# Patient Record
Sex: Female | Born: 1965 | Race: White | Hispanic: No | Marital: Married | Smoking: Current every day smoker
Health system: Southern US, Community
[De-identification: ages and names within clinical notes are randomized; demographics above are authoritative.]

## PROBLEM LIST (undated history)

## (undated) DIAGNOSIS — G610 Guillain-Barre syndrome: Secondary | ICD-10-CM

## (undated) DIAGNOSIS — K219 Gastro-esophageal reflux disease without esophagitis: Secondary | ICD-10-CM

## (undated) DIAGNOSIS — T8859XA Other complications of anesthesia, initial encounter: Secondary | ICD-10-CM

## (undated) DIAGNOSIS — R112 Nausea with vomiting, unspecified: Secondary | ICD-10-CM

## (undated) DIAGNOSIS — T4145XA Adverse effect of unspecified anesthetic, initial encounter: Secondary | ICD-10-CM

## (undated) DIAGNOSIS — Z9889 Other specified postprocedural states: Secondary | ICD-10-CM

## (undated) HISTORY — PX: CHOLECYSTECTOMY: SHX55

## (undated) HISTORY — PX: OOPHORECTOMY: SHX86

## (undated) HISTORY — PX: MENISCUS REPAIR: SHX5179

## (undated) HISTORY — PX: ABDOMINAL HYSTERECTOMY: SHX81

## (undated) HISTORY — PX: APPENDECTOMY: SHX54

---

## 1991-03-03 DIAGNOSIS — G610 Guillain-Barre syndrome: Secondary | ICD-10-CM

## 1991-03-03 HISTORY — DX: Guillain-Barre syndrome: G61.0

## 2004-09-30 ENCOUNTER — Emergency Department: Payer: Self-pay | Admitting: Emergency Medicine

## 2006-02-04 ENCOUNTER — Emergency Department: Payer: Self-pay | Admitting: Emergency Medicine

## 2007-02-15 ENCOUNTER — Ambulatory Visit: Payer: Self-pay | Admitting: Internal Medicine

## 2009-07-15 ENCOUNTER — Ambulatory Visit: Payer: Self-pay | Admitting: Internal Medicine

## 2010-11-04 ENCOUNTER — Ambulatory Visit: Payer: Self-pay | Admitting: Physician Assistant

## 2010-12-03 ENCOUNTER — Ambulatory Visit: Payer: Self-pay | Admitting: General Practice

## 2011-04-16 ENCOUNTER — Ambulatory Visit: Payer: Self-pay | Admitting: Family Medicine

## 2011-10-29 ENCOUNTER — Ambulatory Visit: Payer: Self-pay

## 2011-10-29 ENCOUNTER — Ambulatory Visit: Payer: Self-pay | Admitting: Medical

## 2011-10-29 LAB — COMPREHENSIVE METABOLIC PANEL
Albumin: 4.1 g/dL (ref 3.4–5.0)
Alkaline Phosphatase: 146 U/L — ABNORMAL HIGH (ref 50–136)
Bilirubin,Total: 0.9 mg/dL (ref 0.2–1.0)
Chloride: 104 mmol/L (ref 98–107)
Co2: 29 mmol/L (ref 21–32)
Creatinine: 1.1 mg/dL (ref 0.60–1.30)
EGFR (Non-African Amer.): >60
Glucose: 107 mg/dL — ABNORMAL HIGH (ref 65–99)
Osmolality: 278 (ref 275–301)
Sodium: 139 mmol/L (ref 136–145)
Total Protein: 7.5 g/dL (ref 6.4–8.2)

## 2011-10-29 LAB — CBC WITH DIFFERENTIAL/PLATELET
Basophil #: 0 10*3/uL (ref 0.0–0.1)
MCH: 31.8 pg (ref 26.0–34.0)
MCHC: 33.8 g/dL (ref 32.0–36.0)
Monocyte #: 0.8 x10 3/mm (ref 0.2–0.9)
Neutrophil %: 71.3 %
Platelet: 173 10*3/uL (ref 150–440)
WBC: 6.9 10*3/uL (ref 3.6–11.0)

## 2011-10-29 LAB — URINALYSIS, COMPLETE
Blood: NEGATIVE
Glucose,UR: NEGATIVE mg/dL (ref 0–75)
Ketone: NEGATIVE
Leukocyte Esterase: NEGATIVE
Ph: 6 (ref 4.5–8.0)
Protein: 30
Specific Gravity: 1.025 (ref 1.003–1.030)

## 2011-10-30 ENCOUNTER — Ambulatory Visit: Payer: Self-pay | Admitting: Family Medicine

## 2011-11-10 ENCOUNTER — Ambulatory Visit: Payer: Self-pay | Admitting: Family Medicine

## 2011-11-23 ENCOUNTER — Ambulatory Visit: Payer: Self-pay | Admitting: Surgery

## 2011-11-23 LAB — POTASSIUM: Potassium: 3.8 mmol/L (ref 3.5–5.1)

## 2013-01-18 ENCOUNTER — Ambulatory Visit: Payer: Self-pay | Admitting: Family Medicine

## 2013-05-17 ENCOUNTER — Ambulatory Visit: Payer: Self-pay | Admitting: Physician Assistant

## 2014-06-19 NOTE — Op Note (Signed)
PATIENT NAME:  Alyssa Johnson MR#:  [id number] DATE OF BIRTH:  24-Mar-1965  DATE OF PROCEDURE:  11/23/2011  PREOPERATIVE DIAGNOSIS: Symptomatic cholelithiasis.  POSTOPERATIVE DIAGNOSIS: Gallbladder hydrops.   PROCEDURE PERFORMED: Laparoscopic cholecystectomy with cholangiogram.   SURGEON: Marlyce Huge, MD  ANESTHESIA: General.   ESTIMATED BLOOD LOSS: 20 mL.   COMPLICATIONS: None.   SPECIMENS: Gallbladder.   INTRAOPERATIVE FINDINGS: Normal cholangiogram and gallbladder hydrops.   INDICATION FOR SURGERY: [name] is a pleasant 49 year old female with history of right upper quadrant pain and an ultrasound which showed stones that had been going on for the past year, on and off, related to food. I thought that she would benefit from laparoscopic cholecystectomy.   DETAILS OF PROCEDURE: Informed consent was obtained. The patient was brought to the operating room suite. She was laid supine on the operating room table. She was induced, endotracheal tube was placed, and general anesthesia was administered. Her abdomen was then prepped and draped in standard surgical fashion. A time out was then performed correctly identifying the patient name, operative site, and procedure to be performed. A supraumbilical incision was made. This was deepened down to the fascia, the fascia was grasped, the fascia was incised, and a sterile finger was put through the fascia. There were no adhesions to the underlying fascia. Two 0 Vicryl stay sutures were placed through the fascia. A Hassan trocar was placed into the abdomen and the abdomen was insufflated. A 10 mm 30 degree scope was placed through the trocar. The gallbladder was then visualized. It appeared to be stuck to the surrounding tissues. We then placed an 11 mm epigastric port and two 5 mm subcostal ports, one the midclavicular line and one at the anterior axillary line. The gallbladder was grasped by the fundus. It was retracted over the dome of the  liver. Adhesions were taken off. The cystic duct and cystic artery were dissected out. The critical view was obtained. I then placed a clip across the proximal cystic duct and ductotomy was made. A cholangiogram catheter was then placed through the duct. Cholangiogram was performed which showed good proximal and distal filling and free flow of contrast into the duodenum. The catheter was then withdrawn. The duct was then clipped twice down and ligated. The cystic artery was clipped three times and ligated. The gallbladder was then taken off the gallbladder fossa using electrocautery. There was leakage of gallbladder contents which was clear consistent with hydrops. The gallbladder was then taken out with an endocatch bag. The abdomen was then irrigated with a liter of sterile normal saline. All hemostasis was achieved. The trocars were then taken out under direct visualization. The supraumbilical incision was closed with the previously placed stay sutures as well as a single interrupted 0 Vicryl transfascial suture. The skin was then closed using interrupted 4-0 Monocryl dermal sutures. Steri-Strips, Telfa gauze, and Tegaderm were placed over the wounds. The patient was then awaken, extubated, and brought to the postanesthesia care unit. There were no immediate complications. Needle, sponge and instrument counts were correct at the end of the procedure.  ____________________________ Glena Norfolk. Leny Morozov, MD cal:slb D: 11/23/2011 09:17:02 ET T: 11/23/2011 09:58:32 ET JOB#: [id number]  cc: Harrell Gave A. Shandra Szymborski, MD, <Dictator> Floyde Parkins MD ELECTRONICALLY SIGNED 11/24/2011 19:55

## 2015-10-07 ENCOUNTER — Other Ambulatory Visit: Payer: Self-pay | Admitting: Family Medicine

## 2015-10-07 DIAGNOSIS — Z1231 Encounter for screening mammogram for malignant neoplasm of breast: Secondary | ICD-10-CM

## 2015-10-22 ENCOUNTER — Other Ambulatory Visit: Payer: Self-pay | Admitting: Family Medicine

## 2015-10-22 ENCOUNTER — Ambulatory Visit
Admission: RE | Admit: 2015-10-22 | Discharge: 2015-10-22 | Disposition: A | Discharge status: Home / Self Care | Payer: 59 | Source: Ambulatory Visit | Attending: Family Medicine | Admitting: Family Medicine

## 2015-10-22 DIAGNOSIS — Z1231 Encounter for screening mammogram for malignant neoplasm of breast: Secondary | ICD-10-CM | POA: Diagnosis not present

## 2017-07-06 ENCOUNTER — Other Ambulatory Visit: Payer: Self-pay | Admitting: Family Medicine

## 2017-07-06 DIAGNOSIS — Z1231 Encounter for screening mammogram for malignant neoplasm of breast: Secondary | ICD-10-CM

## 2017-07-21 ENCOUNTER — Ambulatory Visit
Admission: RE | Admit: 2017-07-21 | Discharge: 2017-07-21 | Disposition: A | Discharge status: Home / Self Care | Payer: BLUE CROSS/BLUE SHIELD | Source: Ambulatory Visit | Attending: Family Medicine | Admitting: Family Medicine

## 2017-07-21 DIAGNOSIS — Z1231 Encounter for screening mammogram for malignant neoplasm of breast: Secondary | ICD-10-CM | POA: Insufficient documentation

## 2017-10-21 ENCOUNTER — Encounter
Admission: RE | Admit: 2017-10-21 | Discharge: 2017-10-21 | Disposition: A | Discharge status: Home / Self Care | Payer: BLUE CROSS/BLUE SHIELD | Source: Ambulatory Visit | Attending: Orthopedic Surgery | Admitting: Orthopedic Surgery

## 2017-10-21 ENCOUNTER — Other Ambulatory Visit: Payer: Self-pay

## 2017-10-21 HISTORY — DX: Guillain-Barre syndrome: G61.0

## 2017-10-21 HISTORY — DX: Nausea with vomiting, unspecified: R11.2

## 2017-10-21 HISTORY — DX: Other complications of anesthesia, initial encounter: T88.59XA

## 2017-10-21 HISTORY — DX: Nausea with vomiting, unspecified: Z98.890

## 2017-10-21 HISTORY — DX: Gastro-esophageal reflux disease without esophagitis: K21.9

## 2017-10-21 HISTORY — DX: Adverse effect of unspecified anesthetic, initial encounter: T41.45XA

## 2017-10-21 NOTE — Patient Instructions (Signed)
Your procedure is scheduled on: 10-25-17  Report to Same Day Surgery 2nd floor medical mall [facility] Entrance-take elevator on left to 2nd floor.  Check in with surgery information desk.) To find out your arrival time please call [phone] between 1PM - 3PM on 10-22-17  Remember: Instructions that are not followed completely may result in serious medical risk, up to and including death, or upon the discretion of your surgeon and anesthesiologist your surgery may need to be rescheduled.    _x___ 1. Do not eat food after midnight the night before your procedure. You may drink clear liquids up to 2 hours before you are scheduled to arrive at the hospital for your procedure.  Do not drink clear liquids within 2 hours of your scheduled arrival to the hospital.  Clear liquids include  --Water or Apple juice without pulp  --Clear carbohydrate beverage such as ClearFast or Gatorade  --Black Coffee or Clear Tea (No milk, no creamers, do not add anything to the coffee or Tea   ____Ensure clear carbohydrate drink on the way to the hospital for bariatric patients  ____Ensure clear carbohydrate drink 3 hours before surgery for Dr Dwyane Luo patients if physician instructed.   No gum chewing or hard candies.     __x__ 2. No Alcohol for 24 hours before or after surgery.   __x__3. No Smoking or e-cigarettes for 24 prior to surgery.  Do not use any chewable tobacco products for at least 6 hour prior to surgery   ____  4. Bring all medications with you on the day of surgery if instructed.    __x__ 5. Notify your doctor if there is any change in your medical condition     (cold, fever, infections).    x___6. On the morning of surgery brush your teeth with toothpaste and water.  You may rinse your mouth with mouth wash if you wish.  Do not swallow any toothpaste or mouthwash.   Do not wear jewelry, make-up, hairpins, clips or nail polish.  Do not wear lotions, powders, or perfumes. You may wear  deodorant.  Do not shave 48 hours prior to surgery. Men may shave face and neck.  Do not bring valuables to the hospital.    [facility] is not responsible for any belongings or valuables.               Contacts, dentures or bridgework may not be worn into surgery.  Leave your suitcase in the car. After surgery it may be brought to your room.  For patients admitted to the hospital, discharge time is determined by your  treatment team.  _  Patients discharged the day of surgery will not be allowed to drive home.  You will need someone to drive you home and stay with you the night of your procedure.    Please read over the following fact sheets that you were given:   [facility] Preparing for Surgery  _x___ TAKE THE FOLLOWING MEDICATION THE MORNING OF SURGERY WITH A SMALL SIP OF WATER. These include:  1. PRILOSEC  2. TAKE AN EXTRA PRILOSEC THE NIGHT BEFORE YOUR SURGERY  3. YOU MAY TAKE OXYCODONE IF NEEDED DAY OF SURGERY  4.  5.  6.  ____Fleets enema or Magnesium Citrate as directed.   ____ Use CHG Soap or sage wipes as directed on instruction sheet   ____ Use inhalers on the day of surgery and bring to hospital day of surgery  ____ Stop Metformin and Janumet  2 days prior to surgery.    ____ Take 1/2 of usual insulin dose the night before surgery and none on the morning surgery.   ____ Follow recommendations from Cardiologist, Pulmonologist or PCP regarding stopping Aspirin, Coumadin, Plavix ,Eliquis, Effient, or Pradaxa, and Pletal.  X____Stop Anti-inflammatories such as Advil, Aleve, Ibuprofen, Motrin, Naproxen, Naprosyn, Goodies powders or aspirin products NOW-OK to take Tylenol    ____ Stop supplements until after surgery.    ____ Bring C-Pap to the hospital.

## 2017-10-24 MED ORDER — CEFAZOLIN SODIUM-DEXTROSE 2-4 GM/100ML-% IV SOLN
2.0000 g | Freq: Once | INTRAVENOUS | Status: AC
  Administered 2017-10-25: 2 g via INTRAVENOUS

## 2017-10-25 ENCOUNTER — Other Ambulatory Visit: Payer: Self-pay

## 2017-10-25 ENCOUNTER — Ambulatory Visit: Payer: BLUE CROSS/BLUE SHIELD

## 2017-10-25 ENCOUNTER — Encounter
Admission: RE | Disposition: A | Discharge status: Home / Self Care | Payer: Self-pay | Source: Ambulatory Visit | Attending: Orthopedic Surgery

## 2017-10-25 ENCOUNTER — Ambulatory Visit: Payer: BLUE CROSS/BLUE SHIELD | Admitting: Anesthesiology

## 2017-10-25 ENCOUNTER — Ambulatory Visit
Admission: RE | Admit: 2017-10-25 | Discharge: 2017-10-25 | Disposition: A | Discharge status: Home / Self Care | Payer: BLUE CROSS/BLUE SHIELD | Source: Ambulatory Visit | Attending: Orthopedic Surgery | Admitting: Orthopedic Surgery

## 2017-10-25 DIAGNOSIS — K219 Gastro-esophageal reflux disease without esophagitis: Secondary | ICD-10-CM | POA: Insufficient documentation

## 2017-10-25 DIAGNOSIS — W19XXXA Unspecified fall, initial encounter: Secondary | ICD-10-CM | POA: Diagnosis not present

## 2017-10-25 DIAGNOSIS — Z87891 Personal history of nicotine dependence: Secondary | ICD-10-CM | POA: Insufficient documentation

## 2017-10-25 DIAGNOSIS — Z79899 Other long term (current) drug therapy: Secondary | ICD-10-CM | POA: Insufficient documentation

## 2017-10-25 DIAGNOSIS — S8262XA Displaced fracture of lateral malleolus of left fibula, initial encounter for closed fracture: Secondary | ICD-10-CM | POA: Insufficient documentation

## 2017-10-25 DIAGNOSIS — Z419 Encounter for procedure for purposes other than remedying health state, unspecified: Secondary | ICD-10-CM

## 2017-10-25 HISTORY — PX: ORIF ANKLE FRACTURE: SHX5408

## 2017-10-25 SURGERY — OPEN REDUCTION INTERNAL FIXATION (ORIF) ANKLE FRACTURE
Anesthesia: General | Site: Ankle | Laterality: Left | Wound class: Clean

## 2017-10-25 MED ORDER — NEOMYCIN-POLYMYXIN B GU 40-200000 IR SOLN
Status: AC
  Filled 2017-10-25: qty 4

## 2017-10-25 MED ORDER — LIDOCAINE-EPINEPHRINE 1 %-1:100000 IJ SOLN
INTRAMUSCULAR | Status: DC | PRN
  Administered 2017-10-25: 9 mL

## 2017-10-25 MED ORDER — FENTANYL CITRATE (PF) 100 MCG/2ML IJ SOLN
INTRAMUSCULAR | Status: AC
  Administered 2017-10-25: 50 ug via INTRAVENOUS
  Filled 2017-10-25: qty 2

## 2017-10-25 MED ORDER — SODIUM CHLORIDE 0.9 % IR SOLN
Status: DC | PRN
  Administered 2017-10-25: 1000 mL

## 2017-10-25 MED ORDER — GLYCOPYRROLATE 0.2 MG/ML IJ SOLN
INTRAMUSCULAR | Status: DC | PRN
  Administered 2017-10-25: 0.4 mg via INTRAVENOUS

## 2017-10-25 MED ORDER — ONDANSETRON HCL 4 MG/2ML IJ SOLN
INTRAMUSCULAR | Status: DC | PRN
  Administered 2017-10-25: 4 mg via INTRAVENOUS

## 2017-10-25 MED ORDER — LIDOCAINE HCL (PF) 1 % IJ SOLN
INTRAMUSCULAR | Status: AC
  Filled 2017-10-25: qty 5

## 2017-10-25 MED ORDER — FENTANYL CITRATE (PF) 100 MCG/2ML IJ SOLN
25.0000 ug | INTRAMUSCULAR | Status: DC | PRN
  Administered 2017-10-25 (×2): 50 ug via INTRAVENOUS

## 2017-10-25 MED ORDER — BUPIVACAINE-EPINEPHRINE (PF) 0.25% -1:200000 IJ SOLN
INTRAMUSCULAR | Status: AC
  Filled 2017-10-25: qty 30

## 2017-10-25 MED ORDER — ROPIVACAINE HCL 5 MG/ML IJ SOLN
INTRAMUSCULAR | Status: DC | PRN
  Administered 2017-10-25: 20 mL via PERINEURAL
  Administered 2017-10-25: 10 mL via PERINEURAL

## 2017-10-25 MED ORDER — PENTAFLUOROPROP-TETRAFLUOROETH EX AERO
INHALATION_SPRAY | CUTANEOUS | Status: AC
  Administered 2017-10-25: 30 via TOPICAL
  Filled 2017-10-25: qty 103.5

## 2017-10-25 MED ORDER — FENTANYL CITRATE (PF) 100 MCG/2ML IJ SOLN
INTRAMUSCULAR | Status: DC | PRN
  Administered 2017-10-25: 50 ug via INTRAVENOUS
  Administered 2017-10-25 (×2): 75 ug via INTRAVENOUS
  Administered 2017-10-25 (×2): 25 ug via INTRAVENOUS

## 2017-10-25 MED ORDER — LIDOCAINE-EPINEPHRINE 1 %-1:100000 IJ SOLN
INTRAMUSCULAR | Status: AC
  Filled 2017-10-25: qty 1

## 2017-10-25 MED ORDER — GLYCOPYRROLATE 0.2 MG/ML IJ SOLN
INTRAMUSCULAR | Status: AC
  Filled 2017-10-25: qty 2

## 2017-10-25 MED ORDER — OXYCODONE HCL 5 MG PO TABS: 5.0000 mg | ORAL_TABLET | Freq: Once | ORAL | Status: DC | PRN

## 2017-10-25 MED ORDER — ROPIVACAINE HCL 5 MG/ML IJ SOLN
INTRAMUSCULAR | Status: AC
  Filled 2017-10-25: qty 30

## 2017-10-25 MED ORDER — MIDAZOLAM HCL 2 MG/2ML IJ SOLN
INTRAMUSCULAR | Status: DC | PRN
  Administered 2017-10-25: 2 mg via INTRAVENOUS

## 2017-10-25 MED ORDER — LIDOCAINE HCL (CARDIAC) PF 100 MG/5ML IV SOSY
PREFILLED_SYRINGE | INTRAVENOUS | Status: DC | PRN
  Administered 2017-10-25: 100 mg via INTRAVENOUS

## 2017-10-25 MED ORDER — ASPIRIN EC 325 MG PO TBEC: 325.0000 mg | DELAYED_RELEASE_TABLET | Freq: Every day | ORAL | 0 refills | Status: AC

## 2017-10-25 MED ORDER — LIDOCAINE HCL (PF) 2 % IJ SOLN
INTRAMUSCULAR | Status: AC
  Filled 2017-10-25: qty 10

## 2017-10-25 MED ORDER — PROMETHAZINE HCL 25 MG/ML IJ SOLN: 6.2500 mg | INTRAMUSCULAR | Status: DC | PRN

## 2017-10-25 MED ORDER — ONDANSETRON 4 MG PO TBDP: 4.0000 mg | ORAL_TABLET | Freq: Three times a day (TID) | ORAL | 0 refills | Status: DC | PRN

## 2017-10-25 MED ORDER — FENTANYL CITRATE (PF) 250 MCG/5ML IJ SOLN
INTRAMUSCULAR | Status: AC
  Filled 2017-10-25: qty 5

## 2017-10-25 MED ORDER — DEXAMETHASONE SODIUM PHOSPHATE 10 MG/ML IJ SOLN
INTRAMUSCULAR | Status: DC | PRN
  Administered 2017-10-25: 5 mg via INTRAVENOUS

## 2017-10-25 MED ORDER — LACTATED RINGERS IV SOLN
INTRAVENOUS | Status: DC
  Administered 2017-10-25: 07:00:00 via INTRAVENOUS

## 2017-10-25 MED ORDER — NEOSTIGMINE METHYLSULFATE 10 MG/10ML IV SOLN
INTRAVENOUS | Status: AC
  Filled 2017-10-25: qty 1

## 2017-10-25 MED ORDER — KETOROLAC TROMETHAMINE 30 MG/ML IJ SOLN
INTRAMUSCULAR | Status: DC | PRN
  Administered 2017-10-25: 30 mg via INTRAVENOUS

## 2017-10-25 MED ORDER — PROPOFOL 10 MG/ML IV BOLUS
INTRAVENOUS | Status: AC
  Filled 2017-10-25: qty 20

## 2017-10-25 MED ORDER — ROCURONIUM BROMIDE 50 MG/5ML IV SOLN
INTRAVENOUS | Status: AC
  Filled 2017-10-25: qty 2

## 2017-10-25 MED ORDER — OXYCODONE HCL 5 MG PO TABS: 5.0000 mg | ORAL_TABLET | ORAL | 0 refills | Status: AC | PRN

## 2017-10-25 MED ORDER — ROCURONIUM BROMIDE 100 MG/10ML IV SOLN
INTRAVENOUS | Status: DC | PRN
  Administered 2017-10-25: 50 mg via INTRAVENOUS

## 2017-10-25 MED ORDER — DEXAMETHASONE SODIUM PHOSPHATE 10 MG/ML IJ SOLN
INTRAMUSCULAR | Status: AC
  Filled 2017-10-25: qty 1

## 2017-10-25 MED ORDER — ACETAMINOPHEN 500 MG PO TABS: 1000.0000 mg | ORAL_TABLET | Freq: Three times a day (TID) | ORAL | 2 refills | Status: AC

## 2017-10-25 MED ORDER — MIDAZOLAM HCL 2 MG/2ML IJ SOLN
INTRAMUSCULAR | Status: AC
  Filled 2017-10-25: qty 2

## 2017-10-25 MED ORDER — PENTAFLUOROPROP-TETRAFLUOROETH EX AERO
INHALATION_SPRAY | CUTANEOUS | Status: DC | PRN
  Administered 2017-10-25: 30 via TOPICAL

## 2017-10-25 MED ORDER — NEOSTIGMINE METHYLSULFATE 10 MG/10ML IV SOLN
INTRAVENOUS | Status: DC | PRN
  Administered 2017-10-25: 3 mg via INTRAVENOUS

## 2017-10-25 MED ORDER — PROPOFOL 10 MG/ML IV BOLUS
INTRAVENOUS | Status: DC | PRN
  Administered 2017-10-25: 170 mg via INTRAVENOUS

## 2017-10-25 MED ORDER — OXYCODONE HCL 5 MG/5ML PO SOLN: 5.0000 mg | Freq: Once | ORAL | Status: DC | PRN

## 2017-10-25 SURGICAL SUPPLY — 61 items
BANDAGE ACE 4X5 VEL STRL LF (GAUZE/BANDAGES/DRESSINGS) ×3 IMPLANT
BANDAGE ACE 6X5 VEL STRL LF (GAUZE/BANDAGES/DRESSINGS) ×3 IMPLANT
BIT DRILL 2.0 (BIT) ×3 IMPLANT
BLADE SURG 15 STRL LF DISP TIS (BLADE) ×4 IMPLANT
BLADE SURG 15 STRL SS (BLADE) ×2
BLADE SURG SZ10 CARB STEEL (BLADE) ×3 IMPLANT
BNDG COHESIVE 4X5 TAN STRL (GAUZE/BANDAGES/DRESSINGS) IMPLANT
BNDG ESMARK 6X12 TAN STRL LF (GAUZE/BANDAGES/DRESSINGS) ×3 IMPLANT
CANISTER SUCT 1200ML W/VALVE (MISCELLANEOUS) ×3 IMPLANT
CAST PADDING 6X4YD ST 30248 (SOFTGOODS) ×2
CHLORAPREP W/TINT 26ML (MISCELLANEOUS) ×3 IMPLANT
COUNTERSINK (MISCELLANEOUS) ×3
CUFF TOURN 24 STER (MISCELLANEOUS) IMPLANT
CUFF TOURN 30 STER DUAL PORT (MISCELLANEOUS) ×3 IMPLANT
DRAPE C-ARM XRAY 36X54 (DRAPES) ×3 IMPLANT
DRAPE C-ARMOR (DRAPES) ×3 IMPLANT
DRAPE U-SHAPE 47X51 STRL (DRAPES) ×3 IMPLANT
DRILL 2.6X122MM WL AO SHAFT (BIT) ×3 IMPLANT
DRILL OVER 2.7X220 (BIT) ×3 IMPLANT
ELECT CAUTERY BLADE 6.4 (BLADE) ×3 IMPLANT
ELECT REM PT RETURN 9FT ADLT (ELECTROSURGICAL) ×3
ELECTRODE REM PT RTRN 9FT ADLT (ELECTROSURGICAL) ×2 IMPLANT
GAUZE PETRO XEROFOAM 1X8 (MISCELLANEOUS) ×3 IMPLANT
GAUZE SPONGE 4X4 12PLY STRL (GAUZE/BANDAGES/DRESSINGS) ×3 IMPLANT
GLOVE BIOGEL PI IND STRL 8 (GLOVE) ×2 IMPLANT
GLOVE BIOGEL PI INDICATOR 8 (GLOVE) ×1
GLOVE SURG SYN 7.5  E (GLOVE) ×2
GLOVE SURG SYN 7.5 E (GLOVE) ×4 IMPLANT
GOWN STRL REUS W/ TWL LRG LVL3 (GOWN DISPOSABLE) ×2 IMPLANT
GOWN STRL REUS W/ TWL XL LVL3 (GOWN DISPOSABLE) ×2 IMPLANT
GOWN STRL REUS W/TWL LRG LVL3 (GOWN DISPOSABLE) ×1
GOWN STRL REUS W/TWL XL LVL3 (GOWN DISPOSABLE) ×1
K-WIRE ORTHOPEDIC 1.4X150L (WIRE) ×6
KIT TURNOVER KIT A (KITS) ×3 IMPLANT
KWIRE ORTHOPEDIC 1.4X150L (WIRE) ×4 IMPLANT
NS IRRIG 1000ML POUR BTL (IV SOLUTION) ×3 IMPLANT
PACK EXTREMITY ARMC (MISCELLANEOUS) ×3 IMPLANT
PAD CAST CTTN 4X4 STRL (SOFTGOODS) ×6 IMPLANT
PADDING CAST COTTON 4X4 STRL (SOFTGOODS) ×3
PADDING CAST COTTON 6X4 ST (SOFTGOODS) ×4 IMPLANT
PLATE FIBULA 4H (Plate) ×3 IMPLANT
SCREW 3.5X10MM (Screw) ×9 IMPLANT
SCREW BONE 2.7X16MM (Screw) ×3 IMPLANT
SCREW BONE 2.7X18MM (Screw) ×3 IMPLANT
SCREW BONE 3.5X16MM (Screw) ×3 IMPLANT
SCREW BONE ANKLE 3.5X14MM (Screw) ×3 IMPLANT
SCREW BONE NON-LCKING 3.5X12MM (Screw) ×6 IMPLANT
SCREW COUNTERSINK (MISCELLANEOUS) ×2 IMPLANT
SCREW LOCK 3.5X10MM (Screw) ×6 IMPLANT
SCREW LOCKING 3.5X18MM (Screw) ×3 IMPLANT
SPLINT FAST PLASTER 5X30 (CAST SUPPLIES) ×1
SPLINT PLASTER CAST FAST 5X30 (CAST SUPPLIES) ×2 IMPLANT
SPONGE LAP 18X18 RF (DISPOSABLE) IMPLANT
STAPLER SKIN PROX 35W (STAPLE) ×3 IMPLANT
STOCKINETTE STRL 6IN 960660 (GAUZE/BANDAGES/DRESSINGS) ×3 IMPLANT
SUT ETHILON 3-0 FS-10 30 BLK (SUTURE) ×3
SUT VIC AB 0 CT1 36 (SUTURE) ×3 IMPLANT
SUT VIC AB 3-0 SH 27 (SUTURE) ×3
SUT VIC AB 3-0 SH 27X BRD (SUTURE) ×6 IMPLANT
SUTURE EHLN 3-0 FS-10 30 BLK (SUTURE) ×2 IMPLANT
TOWEL OR 17X26 4PK STRL BLUE (TOWEL DISPOSABLE) ×3 IMPLANT

## 2017-10-25 NOTE — Progress Notes (Signed)
Popliteal pulse positive

## 2017-10-25 NOTE — Anesthesia Preprocedure Evaluation (Addendum)
Anesthesia Evaluation  Patient identified by MRN, date of birth, ID band Patient awake    Reviewed: Allergy & Precautions, H&P , NPO status , Patient's Chart, lab work & pertinent test results  History of Anesthesia Complications (+) PONV and history of anesthetic complications  Airway Mallampati: III  TM Distance: <3 FB Neck ROM: full    Dental  (+) Chipped   Pulmonary neg shortness of breath, former smoker,           Cardiovascular Exercise Tolerance: Good (-) angina(-) Past MI and (-) DOE negative cardio ROS       Neuro/Psych  Neuromuscular disease negative psych ROS   GI/Hepatic Neg liver ROS, GERD  Medicated and Controlled,  Endo/Other  negative endocrine ROS  Renal/GU      Musculoskeletal   Abdominal   Peds  Hematology negative hematology ROS (+)   Anesthesia Other Findings Past Medical History: No date: Complication of anesthesia No date: GERD (gastroesophageal reflux disease) 1993: 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[place]) No date: PONV (postoperative nausea and vomiting)     Comment:  HAS ONLY GOTTEN SICK HER HER GB SURGERY  Past Surgical History: No date: ABDOMINAL HYSTERECTOMY No date: APPENDECTOMY No date: CHOLECYSTECTOMY No date: MENISCUS REPAIR; Left No date: OOPHORECTOMY  BMI    Body Mass Index:  31.32 kg/m      Reproductive/Obstetrics negative OB ROS                             Anesthesia Physical Anesthesia Plan  ASA: III  Anesthesia Plan: General ETT   Post-op Pain Management: GA combined w/ Regional for post-op pain   Induction: Intravenous  PONV Risk Score and Plan: Ondansetron, Dexamethasone, Midazolam and Treatment may vary due to age or medical condition  Airway Management Planned: Oral ETT  Additional Equipment:   Intra-op Plan:   Post-operative Plan: Extubation in OR  Informed Consent: I have reviewed the patients History and Physical, chart,  labs and discussed the procedure including the risks, benefits and alternatives for the proposed anesthesia with the patient or authorized representative who has indicated his/her understanding and acceptance.   Dental Advisory Given  Plan Discussed with: Anesthesiologist, CRNA and Surgeon  Anesthesia Plan Comments: (Patient consented for post op nerve block  Patient consented for risks of anesthesia including but not limited to:  - adverse reactions to medications - damage to teeth, lips or other oral mucosa - sore throat or hoarseness - Damage to heart, brain, lungs or loss of life  Patient voiced understanding.)       Anesthesia Quick Evaluation

## 2017-10-25 NOTE — Discharge Instructions (Signed)
Peripheral Nerve Block (Lower Extremity) Discharge Instructions   1.  For your surgery you have received a femoral Nerve Block.  2. Your Nerve Block is expected to last for about 4 to 12 hours.  This is an estimated time frame; the results of your nerve block may wear off sooner or may last longer.  3. If needed, your surgeon will give you a prescription for pain medication.  It will take about 60 minutes for the oral pain medication to become fully effective.  So, it is recommended that you start taking this medication before the nerve block first begins to wear off, or when you first begin to feel discomfort.  4. Keep in mind that nerve blocks often wear off in the middle of the night.   If you are going to bed and the block has not started to wear off or you have not started to have any discomfort, consider setting an alarm for 2 to 3 hours, so you can assess your block.  If you notice the block is wearing off or you are starting to have discomfort, you can take your pain medication.  5. Take your pain medication only as prescribed.  Pain medication can cause sedation and decrease your breathing if you take more than you need for the level of pain that you have.  6. Nausea is a common side effect of many pain medications.  You may want to eat something before taking your pain medicine to prevent nausea.  7. After a Peripheral Nerve block, you cannot feel pain, pressure or extremes in temperature in the effected leg.  Because your leg is numb it is at an increased risk for injury.  To decrease the possibility of injury, please practice the following:  a. While you are awake change the position of your leg frequently to prevent too much pressure on any one area for prolonged periods of time.  b.  If you have a cast or tight dressing, check the color or your toes every couple of hours.  Call your surgeon with the appearance of any discoloration (white or blue).  c. You may have difficulty bearing  weight on the effected leg.  Have someone assist you with walking until the nerve block has completely worn off.   d. If you surgeon prescribed a brace to be worn after surgery, DO NOT GET UP AT NIGHT WITHOUT YOUR BRACE.  e. If your surgeon has restricted the amount of weight you should bear on the effected leg, i.e. No Weight, Partial Weight, or Touch Down Only, DO NOT BEAR MORE WEIGHT THAN INSTRUCTED.   If you experience any problems or concerns, please contact your surgeon. Ankle Fracture Surgery  Post-Op Instructions  1. Bracing or crutches: Crutches/walker will be provided at the time of discharge.  2. Splint/Cast: You will have a splint (3/4 cast) on your leg after surgery. Ensure that this remains clean and dry until follow up appointment. If this becomes wet, you need to call our offices to get it changed or else you risk skin breakdown.    3. Driving:  Plan on not driving for at least four to six weeks. Please note that you are advised NOT to drive while taking narcotic pain medications as you may be impaired and unsafe to drive.  4. Activity: Ankle pumps several times an hour while awake to prevent blood clots. Weight bearing: Non-weight bearing. Use crutches for at least 6 weeks, if not longer based on your surgery. Bending  and straightening the knee is unlimited. Elevate knee above heart level as much as possible for one week. Avoid standing more than 5 minutes (consecutively) for the first week.  Avoid long distance travel for 4 weeks.   5. Medications:  - You have been provided a prescription for narcotic pain medicine. After surgery, take 1-2 narcotic tablets every 4 hours if needed for severe pain. Please start this as soon as you begin to start having pain (if you received a nerve block, start taking as soon as this wears off).  - A prescription for anti-nausea medication will be provided in case the narcotic medicine causes nausea - take 1 tablet every 6 hours only if  nauseated.  - Take enteric coated aspirin 325 mg once daily for 4 weeks to prevent blood clots.  -Take tylenol 1000 mg every 8 hours for pain.  May stop tylenol 5 days after surgery if you are having minimal pain.  If you are taking prescription medication for anxiety, depression, insomnia, muscle spasm, chronic pain, or for attention deficit disorder you are advised that you are at a higher risk of adverse effects with use of narcotics post-op, including narcotic addiction/dependence, depressed breathing, death. If you use non-prescribed substances: alcohol, marijuana, cocaine, heroin, methamphetamines, etc., you are at a higher risk of adverse effects with use of narcotics post-op, including narcotic addiction/dependence, depressed breathing, death. You are advised that taking > 50 morphine milligram equivalents (MME) of narcotic pain medication per day results in twice the risk of overdose or death. For your prescription provided: oxycodone 5 mg - taking more than 6 tablets per day. Be advised that we will prescribe narcotics short-term, for acute post-operative pain only - 1 week for minor operations such as knee arthroscopy for meniscus tear resection, and 3 weeks for major operations such as knee repair/reconstruction surgeries.   6. Physical Therapy: Plan to start after follow up appointment at 2 weeks. 1-2 times per week for ~12-16 weeks. Therapy typically starts on post operative Day 3 or 4. You have been provided an order for physical therapy. The therapist will provide home exercises. Please contact our offices if this appointment has not been scheduled.   7. Work/School: May return to full work when off of crutches. May do light duty/desk job or return to school in approximately 1-2 weeks when off of narcotics, pain is well-controlled, and swelling has decreased.  8. Post-Op Appointments: Your first post-op appointment will be with Dr. Posey Pronto in approximately 2 weeks time.   If you  find that they have not been scheduled please call the Orthopaedic Appointment front desk at [phone].

## 2017-10-25 NOTE — Anesthesia Postprocedure Evaluation (Signed)
Anesthesia Post Note  Patient: Alyssa Johnson  Procedure(s) Performed: OPEN REDUCTION INTERNAL FIXATION (ORIF) ANKLE FRACTURE (Left Ankle)  Patient location during evaluation: PACU Anesthesia Type: General Level of consciousness: awake and alert Pain management: pain level controlled Vital Signs Assessment: post-procedure vital signs reviewed and stable Respiratory status: spontaneous breathing, nonlabored ventilation, respiratory function stable and patient connected to nasal cannula oxygen Cardiovascular status: blood pressure returned to baseline and stable Postop Assessment: no apparent nausea or vomiting Anesthetic complications: no     Last Vitals:  Vitals:   10/25/17 1032 10/25/17 1040  BP: 124/81 123/87  Pulse: 74 69  Resp: 15 10  Temp:  (!) 36.3 C  SpO2: 98% 97%    Last Pain:  Vitals:   10/25/17 1040  TempSrc:   PainSc: 4                  Precious Haws Piscitello

## 2017-10-25 NOTE — Op Note (Signed)
Operative Note   SURGERY DATE: 10/25/2017  PRE-OP DIAGNOSIS:  1. Distal fibula fracture of L ankle   POST-OP DIAGNOSIS:  1. Distal fibula fracture of L ankle  PROCEDURE(S): 1. ORIF L distal fibula  SURGEON: Cato Mulligan, MD   ANESTHESIA: Regional  + Gen  ESTIMATED BLOOD LOSS: 10cc  DRAINS:  None  TOTAL IV FLUIDS: see anesthesia record  IMPLANTS: Stryker VariAx distal fibular plate 4- 3.2GM Stryker locking screws (3 distally, 1 proximal) 2 - 3.29mm Stryker cortical screws proximally 2- 2.51mm Stryker lag screws  INDICATION(S): The patient is a 52 y.o. female who had a fall ~2 weeks ago while in [place] and noted immediate ankle pain. Radiographs showed a lateral malleolus fracture with stress views showing medial clear space widening. After discussion of risks, benefits, and alternatives to surgery, the patient elected to proceed.    OPERATIVE FINDINGS: Distal fibula fracture  OPERATIVE REPORT:   The patient was seen in the Holding Room. The risks, benefits, complications, treatment options, and expected outcomes were discussed with the patient. The risks and potential complications of the problem and proposed treatment include but are not limited to infection, bleeding, pain, stiffness, nerve and vessel injury, hardware failure, malunion/nonunion, and complication secondary to the anesthetic. The patient concurred with the proposed plan, giving informed consent.  The site of surgery was properly noted/marked.   The patient was taken to Operating Room and transferred to the operating room table. A Time Out was held and the patient identity, procedure, and laterality was confirmed. After administration of adequate anesthesia, the entire lower extremity was prescrubbed with Hibiclens and alcohol, prepped with Chloroprep, and draped in sterile fashion. The patient was given pre-operative IV antibiotics within 30 minutes of the skin incision.  The tourniquet was inflated to  277mmHg after exsanguinating the leg with an Esmarch bandage. A standard distal fibular incision was made with a 15 blade along the lateral aspect of the fibula.  Dissection was carried down sharply to the fibula. The fracture site was identified and cleared of any tissue with a combination of dental pick, knife, and curette.  A reduction clamp was placed and the fracture was reduced. Anatomic reduction was confirmed visually and fluoroscopically. A 2.85mm lag screw was placed in an A-P fashion through the main fracture line. The reduction clamp was removed and the fracture remained anatomically reduced. There was an additional fracture line affecting the anterior fibula. This was clamped in place and similarly another 2.33mm screw was place in an A-P fashion. Fluoroscopy was used to confirm appropriate reduction and hardware position.   A Stryker VariAx Distal Fibula plate was selected after confirming appropriate size and position fluoroscopically. The plate was contoured with plate benders as necessary. Three locking screws were placed in the distal fragment. Then 3 additional cortical screws were placed in the proximal fragment. The most proximal screw did not have excellent fixation so it was replaced with a locking screw. Fluoroscopy then confirmed appropriate hardware position and reduction. External rotation stress view was negative.    The wound was then thoroughly irrigated. The tourniquet was deflated with a time of 60 minutes. Hemostasis was achieved with bovie electrocautery. 0 Vicryl sutures were used to close the deep layer over the plate. 3-0 Vicryl was used to close the subdermal layer. Staples were used to close the skin. The wound was dressed with xeroform, fluffs, and cotton guaze.  The leg was then placed in a short leg splint. The patient was awakened from anesthesia  without any further complication and transferred to PACU for further recovery.  Plan will be for post-operative peripheral  nerve block administered by the Anesthesia team.   POST-OPERATIVE PLAN:  Patient will be discharged to home. The patient will be NWB for 6 weeks. ASA x 4 weeks for DVT ppx. Follow up in 2 weeks as an outpatient. Transition to walking boot at that time. Start PT after 2 week appointment.

## 2017-10-25 NOTE — Transfer of Care (Signed)
Immediate Anesthesia Transfer of Care Note  Patient: Alyssa Johnson  Procedure(s) Performed: OPEN REDUCTION INTERNAL FIXATION (ORIF) ANKLE FRACTURE (Left Ankle)  Patient Location: PACU  Anesthesia Type:General  Level of Consciousness: awake and patient cooperative  Airway & Oxygen Therapy: Patient Spontanous Breathing and Patient connected to nasal cannula oxygen  Post-op Assessment: Report given to RN and Post -op Vital signs reviewed and stable  Post vital signs: Reviewed and stable  Last Vitals:  Vitals Value Taken Time  BP    Temp    Pulse    Resp    SpO2      Last Pain:  Vitals:   10/25/17 0615  TempSrc: Tympanic  PainSc: 0-No pain         Complications: No apparent anesthesia complications

## 2017-10-25 NOTE — Anesthesia Procedure Notes (Signed)
Procedure Name: Intubation Date/Time: 10/25/2017 7:30 AM Performed by: Bernardo Heater, CRNA Pre-anesthesia Checklist: Patient identified, Emergency Drugs available, Suction available and Patient being monitored Patient Re-evaluated:Patient Re-evaluated prior to induction Oxygen Delivery Method: Circle system utilized Preoxygenation: Pre-oxygenation with 100% oxygen Induction Type: IV induction Laryngoscope Size: Mac and 3 Grade View: Grade I Tube size: 7.0 mm Number of attempts: 1 Placement Confirmation: ETT inserted through vocal cords under direct vision,  positive ETCO2 and breath sounds checked- equal and bilateral Secured at: 21 cm Tube secured with: Tape Dental Injury: Teeth and Oropharynx as per pre-operative assessment

## 2017-10-25 NOTE — H&P (Signed)
Paper H&P to be scanned into permanent record. H&P reviewed. No significant changes noted.  

## 2017-10-25 NOTE — Progress Notes (Signed)
Left leg elevated on pillows   Capillary refill positive to left foot  Skin warm and ry

## 2017-10-25 NOTE — Anesthesia Procedure Notes (Signed)
Anesthesia Regional Block: Popliteal block   Pre-Anesthetic Checklist: ,, timeout performed, Correct Patient, Correct Site, Correct Laterality, Correct Procedure, Correct Position, site marked, Risks and benefits discussed,  Surgical consent,  Pre-op evaluation,  At surgeon's request and post-op pain management  Laterality: Lower and Left  Prep: chloraprep       Needles:  Injection technique: Single-shot  Needle Type: Echogenic Needle     Needle Length: 9cm  Needle Gauge: 21     Additional Needles:   Procedures:,,,, ultrasound used (permanent image in chart),,,,  Narrative:  Start time: 10/25/2017 10:17 AM End time: 10/25/2017 10:21 AM Injection made incrementally with aspirations every 5 mL.  Performed by: Personally  Anesthesiologist: Piscitello, Precious Haws, MD  Additional Notes: Functioning IV was confirmed and monitors were applied.  A echogenic needle was used. Sterile prep,hand hygiene and sterile gloves were used. Minimal sedation used for procedure.   No paresthesia endorsed by patient during the procedure.  Negative aspiration and negative test dose prior to incremental administration of local anesthetic. The patient tolerated the procedure well with no immediate complications.

## 2017-10-25 NOTE — Anesthesia Post-op Follow-up Note (Signed)
Anesthesia QCDR form completed.        

## 2017-10-26 ENCOUNTER — Encounter: Payer: Self-pay | Admitting: Orthopedic Surgery

## 2017-10-27 ENCOUNTER — Encounter: Payer: Self-pay | Admitting: Orthopedic Surgery

## 2019-02-05 ENCOUNTER — Emergency Department
Admission: EM | Admit: 2019-02-05 | Discharge: 2019-02-05 | Disposition: A | Discharge status: Home / Self Care | Payer: BC Managed Care – PPO | Attending: Emergency Medicine | Admitting: Emergency Medicine

## 2019-02-05 ENCOUNTER — Other Ambulatory Visit: Payer: Self-pay

## 2019-02-05 ENCOUNTER — Emergency Department: Payer: BC Managed Care – PPO

## 2019-02-05 ENCOUNTER — Encounter: Payer: Self-pay | Admitting: Intensive Care

## 2019-02-05 DIAGNOSIS — Y999 Unspecified external cause status: Secondary | ICD-10-CM | POA: Diagnosis not present

## 2019-02-05 DIAGNOSIS — S93401A Sprain of unspecified ligament of right ankle, initial encounter: Secondary | ICD-10-CM | POA: Diagnosis not present

## 2019-02-05 DIAGNOSIS — Y929 Unspecified place or not applicable: Secondary | ICD-10-CM | POA: Insufficient documentation

## 2019-02-05 DIAGNOSIS — F17210 Nicotine dependence, cigarettes, uncomplicated: Secondary | ICD-10-CM | POA: Diagnosis not present

## 2019-02-05 DIAGNOSIS — Y939 Activity, unspecified: Secondary | ICD-10-CM | POA: Diagnosis not present

## 2019-02-05 DIAGNOSIS — W171XXA Fall into storm drain or manhole, initial encounter: Secondary | ICD-10-CM | POA: Diagnosis not present

## 2019-02-05 DIAGNOSIS — Z79899 Other long term (current) drug therapy: Secondary | ICD-10-CM | POA: Insufficient documentation

## 2019-02-05 DIAGNOSIS — S99911A Unspecified injury of right ankle, initial encounter: Secondary | ICD-10-CM | POA: Diagnosis present

## 2019-02-05 DIAGNOSIS — X509XXA Other and unspecified overexertion or strenuous movements or postures, initial encounter: Secondary | ICD-10-CM | POA: Insufficient documentation

## 2019-02-05 MED ORDER — IBUPROFEN 800 MG PO TABS
800.0000 mg | ORAL_TABLET | Freq: Once | ORAL | Status: AC
  Administered 2019-02-05: 800 mg via ORAL
  Filled 2019-02-05: qty 1

## 2019-02-05 NOTE — ED Provider Notes (Signed)
[facility] Emergency Department Provider Note ____________________________________________  Time seen: 1815 I have reviewed the triage vital signs and the nursing notes.  HISTORY  Chief Complaint  Foot Pain (right)   HPI Alyssa Johnson is a 53 y.o. female presents to the ER today with complaint of right ankle pain and swelling.  She reports approximately 1 hour PTA, she stepped in a storm drain and her ankle rolled.  She noticed immediate swelling but has not noticed any bruising.  She reports she is unable to bear weight secondary to pain.  She did apply ice but did not take any medication PTA.  She denies prior ankle injury or surgery.  Past Medical History:  Diagnosis Date  . Complication of anesthesia   . GERD (gastroesophageal reflux disease)   . 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[place]) 1993  . PONV (postoperative nausea and vomiting)    HAS ONLY GOTTEN SICK HER HER GB SURGERY    There are no active problems to display for this patient.   Past Surgical History:  Procedure Laterality Date  . ABDOMINAL HYSTERECTOMY    . APPENDECTOMY    . CHOLECYSTECTOMY    . MENISCUS REPAIR Left   . OOPHORECTOMY    . ORIF ANKLE FRACTURE Left 10/25/2017   Procedure: OPEN REDUCTION INTERNAL FIXATION (ORIF) ANKLE FRACTURE;  Surgeon: Leim Fabry, MD;  Location: [facility] ORS;  Service: Orthopedics;  Laterality: Left;    Prior to Admission medications   Medication Sig Start Date End Date Taking? Authorizing Provider  omeprazole (PRILOSEC) 20 MG capsule Take 20 mg by mouth every morning.     [provider]  ondansetron (ZOFRAN ODT) 4 MG disintegrating tablet Take 1 tablet (4 mg total) by mouth every 8 (eight) hours as needed for nausea or vomiting. 10/25/17   Leim Fabry, MD    Allergies Patient has no known allergies.  Family History  Problem Relation Age of Onset  . Breast cancer Paternal Aunt     Social History Social History   Tobacco Use  . Smoking status:  Current Every Day Smoker    Packs/day: 0.25    Years: 34.00    Pack years: 8.50    Types: Cigarettes  . Smokeless tobacco: Never Used  Substance Use Topics  . Alcohol use: Yes    Comment: OCC  . Drug use: Never    Review of Systems  Constitutional: Negative for fever, chills or body aches. Cardiovascular: Negative for chest pain or chest tightness. Respiratory: Negative for cough or shortness of breath. Musculoskeletal: Positive for right ankle pain and swelling, difficulty with weightbearing.  Negative for knee pain. Skin: Negative for bruising. Neurological: Positive for focal weakness of the right foot.  Negative for tingling or numbness. ____________________________________________  PHYSICAL EXAM:  VITAL SIGNS: ED Triage Vitals  Enc Vitals Group     BP 02/05/19 1757 133/78     Pulse Rate 02/05/19 1757 78     Resp 02/05/19 1757 16     Temp 02/05/19 1757 98.1 F (36.7 C)     Temp Source 02/05/19 1757 Oral     SpO2 02/05/19 1757 96 %     Weight 02/05/19 1801 200 lb (90.7 kg)     Height 02/05/19 1801 5\' 7"  (1.702 m)     Head Circumference --      Peak Flow --      Pain Score 02/05/19 1801 5     Pain Loc --      Pain Edu? --  Excl. in [place]? --     Constitutional: Alert and oriented. Well appearing and in no distress. Cardiovascular: Normal rate, regular rhythm.  Pedal pulses 2+ on the right.  Cap refill less than 3 seconds. Respiratory: Normal respiratory effort. No wheezes/rales/rhonchi. Musculoskeletal: Pain with flexion, extension and rotation of the right ankle.  Pain with palpation over the lateral malleolus.  Pain with palpation of the third fourth and fifth metatarsals.  1+ swelling noted over the lateral malleolus. Neurologic:   Normal speech and language. No gross focal neurologic deficits are appreciated. Skin:  Skin is warm, dry and intact. No bruising noted.  ____________________________________________   RADIOLOGY   Imaging Orders     DG Ankle  Complete Right   IMPRESSION:  No acute osseous abnormalities.    ____________________________________________    INITIAL IMPRESSION / ASSESSMENT AND PLAN / ED COURSE  Right Ankle Pain and Swelling:   Xray negative Likely a sprain Wrapped in ACE wrap Crutches given to support ambulation Weight bearing as tolerated Encouraged RICE therapy Follow up with PCP if pain persists or worsens    I reviewed the patient's prescription history over the last 12 months in the multi-state controlled substances database(s) that includes [place], [place], [place], [place], [place], [place], [place], [place], [place], [place], [place], [place], [place], and [place].  Results were notable for Percocet 5-352 mg, #20, 08/2018. ____________________________________________  FINAL CLINICAL IMPRESSION(S) / ED DIAGNOSES  Final diagnoses:  Sprain of right ankle, unspecified ligament, initial encounter   Webb Silversmith, NP    Jearld Fenton, NP 02/05/19 Raylene Everts, MD 02/06/19 [phone]

## 2019-02-05 NOTE — ED Triage Notes (Signed)
Patient c/o right foot pain after tripping on hole in ground today

## 2019-02-05 NOTE — ED Notes (Signed)
Ace wrap applied and pt given a wheelchair to use to the lobby.

## 2019-02-05 NOTE — Discharge Instructions (Addendum)
You were seen today for right ankle pain and swelling s/p a fall. Your xray is negative for fracture. This in an ankle sprain. We wrapped you in an ACE wrap. I would wear during the day, take off at night. We encouraged elevation, ice and Ibuprofen 600-800 mg every 8 hours with food. You can ambulate as tolerated. I have given you a work note to return in 1 week.

## 2019-05-31 ENCOUNTER — Other Ambulatory Visit: Payer: Self-pay | Admitting: Orthopedic Surgery

## 2019-05-31 ENCOUNTER — Ambulatory Visit
Admission: RE | Admit: 2019-05-31 | Discharge: 2019-05-31 | Disposition: A | Discharge status: Home / Self Care | Payer: BC Managed Care – PPO | Source: Ambulatory Visit | Attending: Orthopedic Surgery | Admitting: Orthopedic Surgery

## 2019-05-31 ENCOUNTER — Other Ambulatory Visit: Payer: Self-pay

## 2019-05-31 DIAGNOSIS — S8262XS Displaced fracture of lateral malleolus of left fibula, sequela: Secondary | ICD-10-CM | POA: Diagnosis not present

## 2020-01-18 ENCOUNTER — Other Ambulatory Visit: Payer: Self-pay | Admitting: Family Medicine

## 2020-01-18 DIAGNOSIS — Z1231 Encounter for screening mammogram for malignant neoplasm of breast: Secondary | ICD-10-CM

## 2020-03-07 ENCOUNTER — Other Ambulatory Visit: Payer: Self-pay

## 2020-03-07 ENCOUNTER — Ambulatory Visit
Admission: RE | Admit: 2020-03-07 | Discharge: 2020-03-07 | Disposition: A | Discharge status: Home / Self Care | Payer: BC Managed Care – PPO | Source: Ambulatory Visit | Attending: Family Medicine | Admitting: Family Medicine

## 2020-03-07 DIAGNOSIS — Z1231 Encounter for screening mammogram for malignant neoplasm of breast: Secondary | ICD-10-CM | POA: Insufficient documentation

## 2020-08-22 ENCOUNTER — Emergency Department: Payer: BC Managed Care – PPO

## 2020-08-22 ENCOUNTER — Encounter: Payer: Self-pay | Admitting: Emergency Medicine

## 2020-08-22 ENCOUNTER — Other Ambulatory Visit: Payer: Self-pay

## 2020-08-22 ENCOUNTER — Emergency Department
Admission: EM | Admit: 2020-08-22 | Discharge: 2020-08-22 | Disposition: A | Discharge status: Home / Self Care | Payer: BC Managed Care – PPO | Attending: Emergency Medicine | Admitting: Emergency Medicine

## 2020-08-22 DIAGNOSIS — F17210 Nicotine dependence, cigarettes, uncomplicated: Secondary | ICD-10-CM | POA: Diagnosis not present

## 2020-08-22 DIAGNOSIS — Z20822 Contact with and (suspected) exposure to covid-19: Secondary | ICD-10-CM | POA: Diagnosis not present

## 2020-08-22 DIAGNOSIS — R0789 Other chest pain: Secondary | ICD-10-CM | POA: Diagnosis not present

## 2020-08-22 DIAGNOSIS — R079 Chest pain, unspecified: Secondary | ICD-10-CM

## 2020-08-22 LAB — BASIC METABOLIC PANEL
Anion gap: 7 (ref 5–15)
BUN: 17 mg/dL (ref 6–20)
CO2: 28 mmol/L (ref 22–32)
Calcium: 9.3 mg/dL (ref 8.9–10.3)
Chloride: 105 mmol/L (ref 98–111)
Creatinine, Ser: 0.94 mg/dL (ref 0.44–1.00)
GFR, Estimated: >60 mL/min (ref >60)
Glucose, Bld: 107 mg/dL — ABNORMAL HIGH (ref 70–99)
Potassium: 3.9 mmol/L (ref 3.5–5.1)
Sodium: 140 mmol/L (ref 135–145)

## 2020-08-22 LAB — CBC
HCT: 43.7 % (ref 36.0–46.0)
Hemoglobin: 14.8 g/dL (ref 12.0–15.0)
MCH: 32 pg (ref 26.0–34.0)
MCHC: 33.9 g/dL (ref 30.0–36.0)
MCV: 94.6 fL (ref 80.0–100.0)
Platelets: 171 10*3/uL (ref 150–400)
RBC: 4.62 MIL/uL (ref 3.87–5.11)
RDW: 12.6 % (ref 11.5–15.5)
WBC: 6.3 10*3/uL (ref 4.0–10.5)
nRBC: 0 % (ref 0.0–0.2)

## 2020-08-22 LAB — TROPONIN I (HIGH SENSITIVITY)
Troponin I (High Sensitivity): 8 ng/L (ref <18)
Troponin I (High Sensitivity): 6 ng/L (ref <18)

## 2020-08-22 LAB — RESP PANEL BY RT-PCR (FLU A&B, COVID) ARPGX2
Influenza A by PCR: NEGATIVE
Influenza B by PCR: NEGATIVE
SARS Coronavirus 2 by RT PCR: NEGATIVE

## 2020-08-22 MED ORDER — IOHEXOL 350 MG/ML SOLN
75.0000 mL | Freq: Once | INTRAVENOUS | Status: AC | PRN
  Administered 2020-08-22: 75 mL via INTRAVENOUS

## 2020-08-22 MED ORDER — LIDOCAINE 5 % EX PTCH
1.0000 | MEDICATED_PATCH | CUTANEOUS | Status: DC
  Administered 2020-08-22: 1 via TRANSDERMAL
  Filled 2020-08-22: qty 1

## 2020-08-22 MED ORDER — LIDOCAINE 5 % EX PTCH: 1.0000 | MEDICATED_PATCH | Freq: Two times a day (BID) | CUTANEOUS | 0 refills | Status: AC

## 2020-08-22 MED ORDER — KETOROLAC TROMETHAMINE 30 MG/ML IJ SOLN
15.0000 mg | Freq: Once | INTRAMUSCULAR | Status: AC
  Administered 2020-08-22: 15 mg via INTRAVENOUS
  Filled 2020-08-22: qty 1

## 2020-08-22 MED ORDER — ACETAMINOPHEN 500 MG PO TABS
1000.0000 mg | ORAL_TABLET | Freq: Once | ORAL | Status: AC
  Administered 2020-08-22: 1000 mg via ORAL
  Filled 2020-08-22: qty 2

## 2020-08-22 NOTE — ED Triage Notes (Signed)
Pt reports she awoke this am with a tightness in her chest that radiated to her left shoulder and neck. Pt denies SOB.

## 2020-08-22 NOTE — ED Provider Notes (Signed)
[facility] Emergency Department Provider Note  ____________________________________________   Event Date/Time   First MD Initiated Contact with Patient 08/22/20 [phone]     (approximate)  I have reviewed the triage vital signs and the nursing notes.   HISTORY  Chief Complaint Chest Pain    HPI Alyssa Johnson is a 55 y.o. female with prior GBS who comes in for chest pain.  Patient reports having chest pain that started at 5 AM this morning constant, moderate, worse with taking a deep breath, nothing makes it better.  She reports that her oxygen levels were in the 90s this morning and her heart rate was low.  She denies a history of pulmonary embolism but states that she had a port and had a complication of a subclavian clot.  It sounds like patient did not need to be on systemic anticoagulation afterwards.  She had her port removed and she did not have any anticoagulation afterwards.  She had the port secondary to being hospitalized for a long period due to GBS.  She denies any shortness of breath at this time but stated that maybe she had a little bit this morning.  She states that she has family history of blood clots.  She denies any leg swelling, abdominal pain or any other concerns.  Denies any injuries.          Past Medical History:  Diagnosis Date   Complication of anesthesia    GERD (gastroesophageal reflux disease)    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[place]) 1993   PONV (postoperative nausea and vomiting)    HAS ONLY GOTTEN SICK HER HER GB SURGERY    There are no problems to display for this patient.   Past Surgical History:  Procedure Laterality Date   ABDOMINAL HYSTERECTOMY     APPENDECTOMY     CHOLECYSTECTOMY     MENISCUS REPAIR Left    OOPHORECTOMY     ORIF ANKLE FRACTURE Left 10/25/2017   Procedure: OPEN REDUCTION INTERNAL FIXATION (ORIF) ANKLE FRACTURE;  Surgeon: Leim Fabry, MD;  Location: [facility] ORS;  Service: Orthopedics;  Laterality: Left;     Prior to Admission medications   Medication Sig Start Date End Date Taking? Authorizing Provider  omeprazole (PRILOSEC) 20 MG capsule Take 20 mg by mouth every morning.     [provider]  ondansetron (ZOFRAN ODT) 4 MG disintegrating tablet Take 1 tablet (4 mg total) by mouth every 8 (eight) hours as needed for nausea or vomiting. 10/25/17   Leim Fabry, MD    Allergies Patient has no known allergies.  Family History  Problem Relation Age of Onset   Breast cancer Paternal Aunt     Social History Social History   Tobacco Use   Smoking status: Every Day    Packs/day: 0.25    Years: 34.00    Pack years: 8.50    Types: Cigarettes   Smokeless tobacco: Never  Vaping Use   Vaping Use: Never used  Substance Use Topics   Alcohol use: Yes    Comment: OCC   Drug use: Never      Review of Systems Constitutional: No fever/chills Eyes: No visual changes. ENT: No sore throat. Cardiovascular: Positive chest pain Respiratory: Denies shortness of breath.  Pain with breathing Gastrointestinal: No abdominal pain.  No nausea, no vomiting.  No diarrhea.  No constipation. Genitourinary: Negative for dysuria. Musculoskeletal: Negative for back pain. Skin: Negative for rash. Neurological: Negative for headaches, focal weakness or numbness. All other  ROS negative ____________________________________________   PHYSICAL EXAM:  VITAL SIGNS: ED Triage Vitals  Enc Vitals Group     BP 08/22/20 0853 131/90     Pulse Rate 08/22/20 0853 74     Resp 08/22/20 0853 20     Temp 08/22/20 0853 97.8 F (36.6 C)     Temp Source 08/22/20 0853 Oral     SpO2 08/22/20 0853 98 %     Weight 08/22/20 0852 198 lb 6.6 oz (90 kg)     Height 08/22/20 0852 5\' 7"  (1.702 m)     Head Circumference --      Peak Flow --      Pain Score 08/22/20 0852 6     Pain Loc --      Pain Edu? --      Excl. in [place]? --     Constitutional: Alert and oriented. Well appearing and in no acute  distress. Eyes: Conjunctivae are normal. EOMI. Head: Atraumatic. Nose: No congestion/rhinnorhea. Mouth/Throat: Mucous membranes are moist.   Neck: No stridor. Trachea Midline. FROM Cardiovascular: Normal rate, regular rhythm. Grossly normal heart sounds.  Good peripheral circulation.  Pain is reproducible on chest wall but no rashes are noted. Respiratory: Normal respiratory effort.  No retractions. Lungs CTAB. Gastrointestinal: Soft and nontender. No distention. No abdominal bruits.  Musculoskeletal: No lower extremity tenderness nor edema.  No joint effusions. Neurologic:  Normal speech and language. No gross focal neurologic deficits are appreciated.  Skin:  Skin is warm, dry and intact. No rash noted. Psychiatric: Mood and affect are normal. Speech and behavior are normal. GU: Deferred   ____________________________________________   LABS (all labs ordered are listed, but only abnormal results are displayed)  Labs Reviewed  BASIC METABOLIC PANEL - Abnormal; Notable for the following components:      Result Value   Glucose, Bld 107 (*)    All other components within normal limits  CBC  POC URINE PREG, ED  TROPONIN I (HIGH SENSITIVITY)   ____________________________________________   ED ECG REPORT I, Vanessa Garrett, the attending physician, personally viewed and interpreted this ECG.  Normal sinus rate of 71, no ST elevation, no T wave inversions, normal intervals ____________________________________________  RADIOLOGY Robert Bellow, personally viewed and evaluated these images (plain radiographs) as part of my medical decision making, as well as reviewing the written report by the radiologist.  ED MD interpretation: No pneumonia  Official radiology report(s): DG Chest 2 View  Result Date: 08/22/2020 CLINICAL DATA:  Chest pain EXAM: CHEST - 2 VIEW COMPARISON:  10/29/2011 chest radiograph. FINDINGS: Stable cardiomediastinal silhouette with normal heart size. No  pneumothorax. No pleural effusion. Lungs appear clear, with no acute consolidative airspace disease and no pulmonary edema. Cholecystectomy clips are seen in the right upper quadrant of the abdomen. IMPRESSION: No active cardiopulmonary disease. Electronically Signed   By: Ilona Sorrel M.D.   On: 08/22/2020 09:39    ____________________________________________   PROCEDURES  Procedure(s) performed (including Critical Care):  Procedures   ____________________________________________   INITIAL IMPRESSION / ASSESSMENT AND PLAN / ED COURSE   Alyssa Johnson was evaluated in Emergency Department on 08/22/2020 for the symptoms described in the history of present illness. She was evaluated in the context of the global COVID-19 pandemic, which necessitated consideration that the patient might be at risk for infection with the SARS-CoV-2 virus that causes COVID-19. Institutional protocols and algorithms that pertain to the evaluation of patients at risk for COVID-19 are in a state  of rapid change based on information released by regulatory bodies including the CDC and federal and state organizations. These policies and algorithms were followed during the patient's care in the ED.    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  She is also worried given her low oxygen levels reported at home although here her oxygen levels are normal and her heart rates are normal so I suspect that could be an inaccurate reading.  We discussed different options and family would like to proceed with CT imaging to rule out pulmonary embolism.  Patient will also need repeat troponin given onset of pain to rule out ACS.  We will give some Tylenol, Toradol, lidocaine patch to help with pain as  well.  Lower suspicion for retained gallstone.  She is had her gallbladder removed and she denies any abdominal pain.  Her abdomen is soft and nontender.    DDx that was also considered d/t potential to cause harm, but was found less likely based on history and physical (as detailed above): -PNA (no fevers, cough but CXR to evaluate) -PNX (reassured with equal b/l breath sounds, CXR to evaluate) -Symptomatic anemia (will get H&H) -Aortic Dissection as no tearing pain and no radiation to the mid back, pulses equal -Pericarditis no rub on exam, EKG changes or hx to suggest dx -Tamponade (no notable SOB, tachycardic, hypotensive) -Esophageal rupture (no h/o diffuse vomitting/no crepitus)  CT was reassuring.  Cardiac markers are negative x2.  Patient's pain is much improved  with the Tylenol and lidocaine patches.  She denies any exertional chest pain recently to suggest this being unstable angina and I have very low suspicion secondary to herpain being reproducible on examination.  No rash at this time to suggest zoster but instructed the family to keep an eye out for this.  COVID swab is pending but patient's oxygen levels 100% would like to leave prior to it resulting.  We will have patient follow-up with a cardiologist due to her risk factor of smoking but otherwise her heart score is low.  I discussed the provisional nature of ED diagnosis, the treatment so far, the ongoing plan of care, follow up appointments and return precautions with the patient and any family or support people present. They expressed understanding and agreed with the plan, discharged home.           ____________________________________________   FINAL CLINICAL IMPRESSION(S) / ED DIAGNOSES   Final diagnoses:  None     MEDICATIONS GIVEN DURING THIS VISIT:  Medications  acetaminophen (TYLENOL) tablet 1,000 mg (has no administration in time range)  ketorolac (TORADOL) 30 MG/ML injection 15 mg (has no  administration in time range)  lidocaine (LIDODERM) 5 % 1 patch (has no administration in time range)     ED Discharge Orders          Ordered    lidocaine (LIDODERM) 5 %  Every 12 hours        08/22/20 1146             Note:  This document was prepared using Dragon voice recognition software and may include unintentional dictation errors.    Vanessa Edenborn, MD 08/22/20 [phone]

## 2020-08-22 NOTE — ED Notes (Signed)
Patient transported to CT 

## 2020-08-22 NOTE — Discharge Instructions (Addendum)
Your work-up was reassuring with negative cardiac markers and your pain seem to be reproducible with pushing on your chest.  However you develop worsening pain you should return to the ER immediately.  Otherwise you can follow-up with the cardiology team.  You take Tylenol 1 g every 8 hours and ibuprofen 600 every 6 hours with food and use the pain patches.  Your COVID swab is pending and that can be followed up on MyChart  MPRESSION:  1. No pulmonary embolism.  2. Small parenchymal band in the peripheral basilar left lower lobe,  compatible with mild scarring versus atelectasis.

## 2020-10-01 ENCOUNTER — Ambulatory Visit: Payer: BC Managed Care – PPO | Admitting: Cardiovascular Disease

## 2020-10-01 ENCOUNTER — Encounter: Payer: Self-pay | Admitting: Cardiovascular Disease

## 2020-10-01 ENCOUNTER — Other Ambulatory Visit: Payer: Self-pay

## 2020-10-01 VITALS — BP 118/68 | HR 73 | Ht 67.0 in | Wt 194.4 lb

## 2020-10-01 DIAGNOSIS — R079 Chest pain, unspecified: Secondary | ICD-10-CM | POA: Diagnosis not present

## 2020-10-01 DIAGNOSIS — Z8669 Personal history of other diseases of the nervous system and sense organs: Secondary | ICD-10-CM

## 2020-10-01 DIAGNOSIS — Z86711 Personal history of pulmonary embolism: Secondary | ICD-10-CM

## 2020-10-01 NOTE — Patient Instructions (Addendum)
Chiropractor Weston Brass [phone]  Medication Instructions:  No changes  If you need a refill on your cardiac medications before your next appointment, please call your pharmacy.   Lab work: No new labs needed  Testing/Procedures: No new testing needed  Follow-Up: At [facility], you and your health needs are our priority.  As part of our continuing mission to provide you with exceptional heart care, we have created designated Provider Care Teams.  These Care Teams include your primary Cardiologist (physician) and Advanced Practice Providers (APPs -  Physician Assistants and Nurse Practitioners) who all work together to provide you with the care you need, when you need it.  You will need a follow up appointment as needed  Providers on your designated Care Team:   Murray Hodgkins, NP Christell Faith, PA-C Marrianne Mood, PA-C [employer], [place]  COVID-19 Vaccine Information can be found at: [email or web address] For questions related to vaccine distribution or appointments, please email [email or web address] or call [phone].

## 2020-10-01 NOTE — Progress Notes (Signed)
Cardiology Office Note  Date:  10/01/2020   ID:  Alyssa Johnson, DOB Jan 10, 1966, MRN [id number]  PCP:  Lynnell Jude, MD   Chief Complaint  Patient presents with   New Patient (Initial Visit)    [facility] follow up; chest pain. Patient c/o chest pain that radiates through to her shoulder blades, up into her neck, down left arm with pain and numbness in hands & fingertips. Medications reviewed by the patient verbally.     HPI:  Alyssa Johnson is a 55 y.o. female with prior  Guillain-Barr syndrome  GERD  Covid in Jan 2022 who presents by referral from Lavera Guise for consultation  of her recent chest pain  Reports since April she has had left-sided chest pain tender on palpation radiating under her left axilla to her left scapular area  Symptoms do not seem to be improving  Seen in the emergency room August 22, 2020 for chest pain CT scan in the hospital, no PE In the emergency room reported worse with taking a deep breath,  nothing makes it better.    history of pulmonary embolism   had a port and had a complication of a subclavian clot.  hospitalized for a long period due to GBS.  family history of blood clots.    Sits at a desk, may have poor posture, typing on a keyboard all day, works at [employer]  Cardiac CT images pulled up and reviewed,  no significant coronary calcifications or aortic atherosclerosis  Notes from primary care requested and reviewed  EKG personally reviewed by myself on todays visit Normal sinus rhythm rate 73 bpm no significant ST-T wave changes   PMH:   has a past medical history of Complication of anesthesia, GERD (gastroesophageal reflux disease), 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[place]) (1993), and PONV (postoperative nausea and vomiting).  PSH:    Past Surgical History:  Procedure Laterality Date   ABDOMINAL HYSTERECTOMY     APPENDECTOMY     CHOLECYSTECTOMY     MENISCUS REPAIR Left    OOPHORECTOMY     ORIF ANKLE FRACTURE Left 10/25/2017    Procedure: OPEN REDUCTION INTERNAL FIXATION (ORIF) ANKLE FRACTURE;  Surgeon: Leim Fabry, MD;  Location: [facility] ORS;  Service: Orthopedics;  Laterality: Left;    Current Outpatient Medications  Medication Sig Dispense Refill   escitalopram (LEXAPRO) 10 MG tablet Take 10 mg by mouth daily.     nitrofurantoin, macrocrystal-monohydrate, (MACROBID) 100 MG capsule Take 100 mg by mouth at bedtime.     omeprazole (PRILOSEC) 20 MG capsule Take 20 mg by mouth every morning.      ondansetron (ZOFRAN ODT) 4 MG disintegrating tablet Take 1 tablet (4 mg total) by mouth every 8 (eight) hours as needed for nausea or vomiting. (Patient not taking: Reported on 10/01/2020) 20 tablet 0   No current facility-administered medications for this visit.     Allergies:   Patient has no known allergies.   Social History:  The patient  reports that she has been smoking cigarettes. She has a 8.50 pack-year smoking history. She has never used smokeless tobacco. She reports current alcohol use. She reports that she does not use drugs.   Family History:   family history includes Breast cancer in her paternal aunt; Colon cancer in her father.    Review of Systems: Review of Systems  Constitutional: Negative.   Respiratory: Negative.    Cardiovascular: Negative.   Gastrointestinal: Negative.   Musculoskeletal: Negative.  Left chest tenderness on palpation, radiated under left axilla to scapular area  Neurological: Negative.   Psychiatric/Behavioral: Negative.    All other systems reviewed and are negative.   PHYSICAL EXAM: VS:  BP 118/68 (BP Location: Right Arm, Patient Position: Sitting, Cuff Size: Large)   Pulse 73   Ht '5\' 7"'$  (1.702 m)   Wt 194 lb 6 oz (88.2 kg)   SpO2 98%   BMI 30.44 kg/m  , BMI Body mass index is 30.44 kg/m. Constitutional:  oriented to person, place, and time. No distress.  HENT:  Head: Grossly normal Eyes:  no discharge. No scleral icterus.  Neck: No JVD, no carotid bruits   Cardiovascular: Regular rate and rhythm, no murmurs appreciated Pulmonary/Chest: Clear to auscultation bilaterally, no wheezes or rails Abdominal: Soft.  no distension.  no tenderness.  Musculoskeletal: Normal range of motion Neurological:  normal muscle tone. Coordination normal. No atrophy Skin: Skin warm and dry Psychiatric: normal affect, pleasant  Recent Labs: 08/22/2020: BUN 17; Creatinine, Ser 0.94; Hemoglobin 14.8; Platelets 171; Potassium 3.9; Sodium 140    Lipid Panel No results found for: CHOL, HDL, LDLCALC, TRIG    Wt Readings from Last 3 Encounters:  10/01/20 194 lb 6 oz (88.2 kg)  08/22/20 198 lb 6.6 oz (90 kg)  02/05/19 200 lb (90.7 kg)       ASSESSMENT AND PLAN:  Problem List Items Addressed This Visit   None Visit Diagnoses     Chest pain of uncertain etiology    -  Primary   Relevant Orders   EKG 12-Lead   History of pulmonary embolism       Hx of Guillain-Barre syndrome          Left-sided chest pain Musculoskeletal, reproducible on palpation left pectoral area radiating around left axilla left scapular area, same rib region Likely has a " rib out", Discussed ergonomics, posture at work, need for lumbar support, shoulders back possibly with a support Discussed icing her back, chiropractic, NSAIDs Avoiding heavy lifting  History of Guillain-Barr syndrome Noted in the chart  History of pulmonary embolism Recent cardiac CTA reviewed, no PE   Total encounter time more than 60 minutes  Greater than 50% was spent in counseling and coordination of care with the patient  Patient was seen in consultation for lower place and will be referred back to her office for ongoing care of the issues detailed above   Signed, Esmond Plants, M.D., Ph.D. [place], [place]

## 2020-12-09 ENCOUNTER — Other Ambulatory Visit: Payer: Self-pay

## 2020-12-09 DIAGNOSIS — Z1211 Encounter for screening for malignant neoplasm of colon: Secondary | ICD-10-CM

## 2020-12-09 DIAGNOSIS — Z800 Family history of malignant neoplasm of digestive organs: Secondary | ICD-10-CM

## 2020-12-09 MED ORDER — NA SULFATE-K SULFATE-MG SULF 17.5-3.13-1.6 GM/177ML PO SOLN: 1.0000 | Freq: Once | ORAL | 0 refills | Status: AC

## 2020-12-09 NOTE — Progress Notes (Signed)
Gastroenterology Pre-Procedure Review  Request Date: 01/16/21 Requesting Physician: Dr. Bonna Gains  PATIENT REVIEW QUESTIONS: The patient responded to the following health history questions as indicated:    1. Are you having any GI issues? no 2. Do you have a personal history of Polyps? no 3. Do you have a family history of Colon Cancer or Polyps? yes (Father colon cancer; Mother polyps) 4. Diabetes Mellitus? no 5. Joint replacements in the past 12 months?no 6. Major health problems in the past 3 months?no 7. Any artificial heart valves, MVP, or defibrillator?no    MEDICATIONS & ALLERGIES:    Patient reports the following regarding taking any anticoagulation/antiplatelet therapy:   Plavix, Coumadin, Eliquis, Xarelto, Lovenox, Pradaxa, Brilinta, or Effient? no Aspirin? no  Patient confirms/reports the following medications:  Current Outpatient Medications  Medication Sig Dispense Refill   escitalopram (LEXAPRO) 10 MG tablet Take 10 mg by mouth daily.     nitrofurantoin, macrocrystal-monohydrate, (MACROBID) 100 MG capsule Take 100 mg by mouth at bedtime.     omeprazole (PRILOSEC) 20 MG capsule Take 20 mg by mouth every morning.      ondansetron (ZOFRAN ODT) 4 MG disintegrating tablet Take 1 tablet (4 mg total) by mouth every 8 (eight) hours as needed for nausea or vomiting. (Patient not taking: Reported on 10/01/2020) 20 tablet 0   No current facility-administered medications for this visit.    Patient confirms/reports the following allergies:  No Known Allergies  No orders of the defined types were placed in this encounter.   AUTHORIZATION INFORMATION Primary Insurance: 1D#: Group #:  Secondary Insurance: 1D#: Group #:  SCHEDULE INFORMATION: Date: 01/16/21 Time: Location: [place]

## 2020-12-11 ENCOUNTER — Encounter: Payer: Self-pay | Admitting: Family Medicine

## 2021-01-16 ENCOUNTER — Other Ambulatory Visit: Payer: Self-pay

## 2021-01-16 ENCOUNTER — Encounter: Payer: Self-pay | Admitting: Gastroenterology

## 2021-01-16 ENCOUNTER — Encounter
Admission: RE | Disposition: A | Discharge status: Home / Self Care | Payer: Self-pay | Source: Ambulatory Visit | Attending: Gastroenterology

## 2021-01-16 ENCOUNTER — Ambulatory Visit: Payer: BC Managed Care – PPO | Admitting: Certified Registered"

## 2021-01-16 ENCOUNTER — Ambulatory Visit
Admission: RE | Admit: 2021-01-16 | Discharge: 2021-01-16 | Disposition: A | Discharge status: Home / Self Care | Payer: BC Managed Care – PPO | Source: Ambulatory Visit | Attending: Gastroenterology | Admitting: Gastroenterology

## 2021-01-16 DIAGNOSIS — Z800 Family history of malignant neoplasm of digestive organs: Secondary | ICD-10-CM | POA: Insufficient documentation

## 2021-01-16 DIAGNOSIS — K219 Gastro-esophageal reflux disease without esophagitis: Secondary | ICD-10-CM | POA: Diagnosis not present

## 2021-01-16 DIAGNOSIS — D125 Benign neoplasm of sigmoid colon: Secondary | ICD-10-CM | POA: Diagnosis not present

## 2021-01-16 DIAGNOSIS — Z1211 Encounter for screening for malignant neoplasm of colon: Secondary | ICD-10-CM | POA: Diagnosis present

## 2021-01-16 DIAGNOSIS — K648 Other hemorrhoids: Secondary | ICD-10-CM | POA: Insufficient documentation

## 2021-01-16 DIAGNOSIS — K5730 Diverticulosis of large intestine without perforation or abscess without bleeding: Secondary | ICD-10-CM | POA: Diagnosis not present

## 2021-01-16 DIAGNOSIS — K635 Polyp of colon: Secondary | ICD-10-CM | POA: Diagnosis not present

## 2021-01-16 DIAGNOSIS — F17210 Nicotine dependence, cigarettes, uncomplicated: Secondary | ICD-10-CM | POA: Insufficient documentation

## 2021-01-16 HISTORY — PX: COLONOSCOPY WITH PROPOFOL: SHX5780

## 2021-01-16 SURGERY — COLONOSCOPY WITH PROPOFOL
Anesthesia: General

## 2021-01-16 MED ORDER — PROPOFOL 500 MG/50ML IV EMUL
INTRAVENOUS | Status: DC | PRN
  Administered 2021-01-16: 175 ug/kg/min via INTRAVENOUS

## 2021-01-16 MED ORDER — PROPOFOL 10 MG/ML IV BOLUS
INTRAVENOUS | Status: DC | PRN
  Administered 2021-01-16: 70 mg via INTRAVENOUS

## 2021-01-16 MED ORDER — SODIUM CHLORIDE 0.9 % IV SOLN: INTRAVENOUS | Status: DC

## 2021-01-16 MED ORDER — PROPOFOL 10 MG/ML IV BOLUS
INTRAVENOUS | Status: AC
  Filled 2021-01-16: qty 40

## 2021-01-16 MED ORDER — LIDOCAINE HCL (PF) 1 % IJ SOLN
INTRAMUSCULAR | Status: AC
  Administered 2021-01-16: 10:00:00 0.05 mL
  Filled 2021-01-16: qty 2

## 2021-01-16 NOTE — Anesthesia Preprocedure Evaluation (Signed)
Anesthesia Evaluation  Patient identified by MRN, date of birth, ID band Patient awake    Reviewed: Allergy & Precautions, H&P , NPO status , Patient's Chart, lab work & pertinent test results  History of Anesthesia Complications (+) PONV and history of anesthetic complications  Airway Mallampati: II  TM Distance: >3 FB Neck ROM: Full    Dental no notable dental hx.    Pulmonary neg pulmonary ROS, Current Smoker and Patient abstained from smoking.,    Pulmonary exam normal        Cardiovascular negative cardio ROS Normal cardiovascular exam     Neuro/Psych Depression  Neuromuscular disease negative psych ROS   GI/Hepatic Neg liver ROS, Bowel prep,GERD  Medicated,  Endo/Other  negative endocrine ROS  Renal/GU negative Renal ROS  negative genitourinary   Musculoskeletal negative musculoskeletal ROS (+)   Abdominal   Peds negative pediatric ROS (+)  Hematology negative hematology ROS (+)   Anesthesia Other Findings Complication of anesthesia    GERD (gastroesophageal reflux disease)  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[facility]) 1993  PONV (postoperative nausea and vomiting)  HAS ONLY GOTTEN SICK HER HER GB SURGERY     Reproductive/Obstetrics negative OB ROS                             Anesthesia Physical Anesthesia Plan  ASA: 2  Anesthesia Plan: General   Post-op Pain Management:    Induction: Intravenous  PONV Risk Score and Plan: 3 and Propofol infusion and TIVA  Airway Management Planned: Natural Airway and Nasal Cannula  Additional Equipment:   Intra-op Plan:   Post-operative Plan:   Informed Consent: I have reviewed the patients History and Physical, chart, labs and discussed the procedure including the risks, benefits and alternatives for the proposed anesthesia with the patient or authorized representative who has indicated his/her understanding and acceptance.       Plan  Discussed with: CRNA, Anesthesiologist and Surgeon  Anesthesia Plan Comments:         Anesthesia Quick Evaluation

## 2021-01-16 NOTE — Op Note (Signed)
[facility] Gastroenterology Patient Name: Alyssa Johnson Procedure Date: 01/16/2021 11:09 AM MRN: [id number] Account #: [id number] Date of Birth: [birth year] Admit Type: Outpatient Age: 55 Room: [facility] ENDO ROOM 2 Gender: Female Note Status: Finalized Instrument Name: Jasper Riling [id number] Procedure:             Colonoscopy Indications:           Screening in patient at increased risk: Family history                         of 1st-degree relative with colorectal cancer Providers:             Lailynn Southgate B. Bonna Gains MD, MD Referring MD:          Lynnell Jude (Referring MD) Medicines:             Monitored Anesthesia Care Complications:         No immediate complications. Procedure:             Pre-Anesthesia Assessment:                        - ASA Grade Assessment: II - A patient with mild                         systemic disease.                        - Prior to the procedure, a History and Physical was                         performed, and patient medications, allergies and                         sensitivities were reviewed. The patient's tolerance                         of previous anesthesia was reviewed.                        - The risks and benefits of the procedure and the                         sedation options and risks were discussed with the                         patient. All questions were answered and informed                         consent was obtained.                        - Patient identification and proposed procedure were                         verified prior to the procedure by the physician, the                         nurse, the anesthesiologist, the anesthetist and the  technician. The procedure was verified in the                         procedure room.                        After obtaining informed consent, the colonoscope was                         passed under direct vision. Throughout the procedure,                          the patient's blood pressure, pulse, and oxygen                         saturations were monitored continuously. The                         Colonoscope was introduced through the anus and                         advanced to the the cecum, identified by appendiceal                         orifice and ileocecal valve. The colonoscopy was                         performed with ease. The patient tolerated the                         procedure well. The quality of the bowel preparation                         was good. Findings:      The perianal and digital rectal examinations were normal.      A 5 mm polyp was found in the sigmoid colon. The polyp was sessile. The       polyp was removed with a cold snare. Resection and retrieval were       complete.      Multiple diverticula were found in the sigmoid colon.      The exam was otherwise without abnormality.      The rectum, sigmoid colon, descending colon, transverse colon, ascending       colon and cecum appeared normal.      Non-bleeding internal hemorrhoids were found during retroflexion. Impression:            - One 5 mm polyp in the sigmoid colon, removed with a                         cold snare. Resected and retrieved.                        - Diverticulosis in the sigmoid colon.                        - The examination was otherwise normal.                        - The rectum, sigmoid colon, descending colon,  transverse colon, ascending colon and cecum are normal.                        - Non-bleeding internal hemorrhoids. Recommendation:        - Discharge patient to home (with escort).                        - Advance diet as tolerated.                        - Continue present medications.                        - Await pathology results.                        - Repeat colonoscopy date to be determined after                         pending pathology results are reviewed.                         - The findings and recommendations were discussed with                         the patient.                        - The findings and recommendations were discussed with                         the patient's family.                        - Return to primary care physician as previously                         scheduled.                        - High fiber diet. Procedure Code(s):     --- Professional ---                        [phone], Colonoscopy, flexible; with removal of                         tumor(s), polyp(s), or other lesion(s) by snare                         technique Diagnosis Code(s):     --- Professional ---                        Z80.0, Family history of malignant neoplasm of                         digestive organs                        K63.5, Polyp of colon CPT copyright 2019 American Medical Association. All rights reserved. The codes documented in this report are preliminary and upon coder review may  be revised to meet current compliance requirements.  Vonda Antigua, MD Margretta Sidle B. Bonna Gains MD, MD 01/16/2021 11:51:00 AM This report has been signed electronically. Number of Addenda: 0 Note Initiated On: 01/16/2021 11:09 AM Scope Withdrawal Time: 0 hours 20 minutes 10 seconds  Total Procedure Duration: 0 hours 24 minutes 13 seconds  Estimated Blood Loss:  Estimated blood loss: none.      [facility]

## 2021-01-16 NOTE — H&P (Signed)
Alyssa Antigua, MD [address] [place], [place], [place], [address] [address], [place], [place], [place], [address] Phone: [phone]  Fax: [phone]  Primary Care Physician:  Lynnell Jude, MD   Pre-Procedure History & Physical: HPI:  Alyssa Johnson is a 55 y.o. female is here for a colonoscopy.   Past Medical History:  Diagnosis Date   Complication of anesthesia    GERD (gastroesophageal reflux disease)    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[place]) 1993   PONV (postoperative nausea and vomiting)    HAS ONLY GOTTEN SICK HER HER GB SURGERY    Past Surgical History:  Procedure Laterality Date   ABDOMINAL HYSTERECTOMY     APPENDECTOMY     CHOLECYSTECTOMY     MENISCUS REPAIR Left    OOPHORECTOMY     ORIF ANKLE FRACTURE Left 10/25/2017   Procedure: OPEN REDUCTION INTERNAL FIXATION (ORIF) ANKLE FRACTURE;  Surgeon: Leim Fabry, MD;  Location: [facility] ORS;  Service: Orthopedics;  Laterality: Left;    Prior to Admission medications   Medication Sig Start Date End Date Taking? Authorizing Provider  escitalopram (LEXAPRO) 10 MG tablet Take 10 mg by mouth daily. 08/06/20  Yes [provider]  nitrofurantoin, macrocrystal-monohydrate, (MACROBID) 100 MG capsule Take 100 mg by mouth at bedtime. 03/11/18  Yes [provider]  omeprazole (PRILOSEC) 20 MG capsule Take 20 mg by mouth every morning.    Yes [provider]  ondansetron (ZOFRAN ODT) 4 MG disintegrating tablet Take 1 tablet (4 mg total) by mouth every 8 (eight) hours as needed for nausea or vomiting. Patient not taking: Reported on 10/01/2020 10/25/17   Leim Fabry, MD    Allergies as of 12/09/2020   (No Known Allergies)    Family History  Problem Relation Age of Onset   Colon cancer Father    Breast cancer Paternal Aunt     Social History   Socioeconomic History   Marital status: Married    Spouse name: Not on file   Number of children: Not on file   Years of education: Not on file    Highest education level: Not on file  Occupational History   Not on file  Tobacco Use   Smoking status: Every Day    Packs/day: 0.25    Years: 34.00    Pack years: 8.50    Types: Cigarettes   Smokeless tobacco: Never  Vaping Use   Vaping Use: Never used  Substance and Sexual Activity   Alcohol use: Yes    Comment: OCC   Drug use: Never   Sexual activity: Not on file  Other Topics Concern   Not on file  Social History Narrative   Not on file   Social Determinants of Health   Financial Resource Strain: Not on file  Food Insecurity: Not on file  Transportation Needs: Not on file  Physical Activity: Not on file  Stress: Not on file  Social Connections: Not on file  Intimate Partner Violence: Not on file    Review of Systems: See HPI, otherwise negative ROS  Physical Exam: Constitutional: General:   Alert,  Well-developed, well-nourished, pleasant and cooperative in NAD BP 113/80   Pulse 86   Temp (!) 97 F (36.1 C) (Temporal)   Resp 18   Ht 5\' 7"  (1.702 m)   Wt 84.8 kg   SpO2 96%   BMI 29.29 kg/m   Head: Normocephalic, atraumatic.   Eyes:  Sclera clear, no icterus.   Conjunctiva pink.  Mouth:  No deformity or lesions, oropharynx pink & moist.  Neck:  Supple, trachea midline  Respiratory: Normal respiratory effort  Gastrointestinal:  Soft, non-tender and non-distended without masses, hepatosplenomegaly or hernias noted.  No guarding or rebound tenderness.     Cardiac: No clubbing or edema.  No cyanosis. Normal posterior tibial pedal pulses noted.  Lymphatic:  No significant cervical adenopathy.  Psych:  Alert and cooperative. Normal mood and affect.  Musculoskeletal:   Symmetrical without gross deformities. 5/5 Lower extremity strength bilaterally.  Skin: Warm. Intact without significant lesions or rashes. No jaundice.  Neurologic:  Face symmetrical, tongue midline, Normal sensation to touch;  grossly normal neurologically.  Psych:  Alert and  oriented x3, Alert and cooperative. Normal mood and affect.  Impression/Plan: [name] is here for a colonoscopy to be performed for family history of colon cancer  Risks, benefits, limitations, and alternatives regarding  colonoscopy have been reviewed with the patient.  Questions have been answered.  All parties agreeable.   Virgel Manifold, MD  01/16/2021, 10:31 AM

## 2021-01-16 NOTE — Anesthesia Postprocedure Evaluation (Signed)
Anesthesia Post Note  Patient: Alyssa Johnson  Procedure(s) Performed: COLONOSCOPY WITH PROPOFOL  Patient location during evaluation: Phase II Anesthesia Type: General Level of consciousness: awake and alert, awake and oriented Pain management: pain level controlled Vital Signs Assessment: post-procedure vital signs reviewed and stable Respiratory status: spontaneous breathing, nonlabored ventilation and respiratory function stable Cardiovascular status: blood pressure returned to baseline and stable Postop Assessment: no apparent nausea or vomiting Anesthetic complications: no   No notable events documented.   Last Vitals:  Vitals:   01/16/21 1155 01/16/21 1205  BP: 115/76 107/75  Pulse:    Resp:  16  Temp:    SpO2:      Last Pain:  Vitals:   01/16/21 1205  TempSrc:   PainSc: 0-No pain                 Phill Mutter

## 2021-01-16 NOTE — Transfer of Care (Signed)
Immediate Anesthesia Transfer of Care Note  Patient: Alyssa Johnson  Procedure(s) Performed: COLONOSCOPY WITH PROPOFOL  Patient Location: Endoscopy Unit  Anesthesia Type:General  Level of Consciousness: awake, alert  and oriented  Airway & Oxygen Therapy: Patient Spontanous Breathing  Post-op Assessment: Report given to RN and Post -op Vital signs reviewed and stable  Post vital signs: Reviewed and stable  Last Vitals:  Vitals Value Taken Time  BP    Temp    Pulse    Resp    SpO2      Last Pain:  Vitals:   01/16/21 1013  TempSrc: Temporal  PainSc: 0-No pain         Complications: No notable events documented.

## 2021-01-17 ENCOUNTER — Encounter: Payer: Self-pay | Admitting: Gastroenterology

## 2021-01-17 LAB — SURGICAL PATHOLOGY

## 2021-02-28 ENCOUNTER — Ambulatory Visit
Admission: EM | Admit: 2021-02-28 | Discharge: 2021-02-28 | Disposition: A | Discharge status: Home / Self Care | Payer: BC Managed Care – PPO

## 2021-02-28 ENCOUNTER — Encounter: Payer: Self-pay | Admitting: Emergency Medicine

## 2021-02-28 DIAGNOSIS — J0100 Acute maxillary sinusitis, unspecified: Secondary | ICD-10-CM | POA: Diagnosis not present

## 2021-02-28 MED ORDER — AMOXICILLIN 875 MG PO TABS: 875.0000 mg | ORAL_TABLET | Freq: Two times a day (BID) | ORAL | 0 refills | Status: AC

## 2021-02-28 NOTE — Discharge Instructions (Addendum)
Take the amoxicillin as directed.  Take plain Mucinex and ibuprofen as directed.  Follow up with your primary care provider if your symptoms are not improving.

## 2021-02-28 NOTE — ED Triage Notes (Addendum)
Pt c/o cough, runny nose, ST, sinus pressure, and bilateral ear pain x 1 week. At home Covid test negative.

## 2021-02-28 NOTE — ED Provider Notes (Signed)
[name]    CSN: [id number] Arrival date & time: 02/28/21  1002      History   Chief Complaint Chief Complaint  Patient presents with   Cough   Sore Throat   Nasal Congestion    HPI Alyssa Johnson is a 55 y.o. female.  Patient presents with 7-day history of ear pain, sore throat, sinus pressure, congestion, runny nose, cough, sinus headache.  Treatment attempted with Tylenol and Mucinex without relief.  She denies fever, chills, rash, shortness of breath, vomiting, diarrhea, or other symptoms.  Her medical history includes Guillain-Barr and GERD.  The history is provided by the patient and medical records.   Past Medical History:  Diagnosis Date   Complication of anesthesia    GERD (gastroesophageal reflux disease)    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[place]) 1993   PONV (postoperative nausea and vomiting)    HAS ONLY GOTTEN SICK HER HER GB SURGERY    Patient Active Problem List   Diagnosis Date Noted   Family history of colon cancer    Polyp of sigmoid colon     Past Surgical History:  Procedure Laterality Date   ABDOMINAL HYSTERECTOMY     APPENDECTOMY     CHOLECYSTECTOMY     COLONOSCOPY WITH PROPOFOL N/A 01/16/2021   Procedure: COLONOSCOPY WITH PROPOFOL;  Surgeon: Virgel Manifold, MD;  Location: [facility];  Service: Endoscopy;  Laterality: N/A;   MENISCUS REPAIR Left    OOPHORECTOMY     ORIF ANKLE FRACTURE Left 10/25/2017   Procedure: OPEN REDUCTION INTERNAL FIXATION (ORIF) ANKLE FRACTURE;  Surgeon: Leim Fabry, MD;  Location: [facility] ORS;  Service: Orthopedics;  Laterality: Left;    OB History   No obstetric history on file.      Home Medications    Prior to Admission medications   Medication Sig Start Date End Date Taking? Authorizing Provider  amoxicillin (AMOXIL) 875 MG tablet Take 1 tablet (875 mg total) by mouth 2 (two) times daily for 10 days. 02/28/21 03/10/21 Yes Sharion Balloon, NP  escitalopram (LEXAPRO) 10 MG tablet Take 10 mg by  mouth daily. 08/06/20   [provider]  nitrofurantoin, macrocrystal-monohydrate, (MACROBID) 100 MG capsule Take 100 mg by mouth at bedtime. 03/11/18   [provider]  omeprazole (PRILOSEC) 20 MG capsule Take 20 mg by mouth every morning.     [provider]  ondansetron (ZOFRAN ODT) 4 MG disintegrating tablet Take 1 tablet (4 mg total) by mouth every 8 (eight) hours as needed for nausea or vomiting. Patient not taking: Reported on 10/01/2020 10/25/17   Leim Fabry, MD  varenicline (CHANTIX) 1 MG tablet Take 1 mg by mouth 2 (two) times daily. 02/25/21   [provider]    Family History Family History  Problem Relation Age of Onset   Colon cancer Father    Breast cancer Paternal Aunt     Social History Social History   Tobacco Use   Smoking status: Every Day    Packs/day: 0.25    Years: 34.00    Pack years: 8.50    Types: Cigarettes   Smokeless tobacco: Never  Vaping Use   Vaping Use: Every day  Substance Use Topics   Alcohol use: Yes    Comment: OCC   Drug use: Never     Allergies   Patient has no known allergies.   Review of Systems Review of Systems  Constitutional:  Negative for chills and fever.  HENT:  Positive for congestion,  ear pain, postnasal drip, sinus pressure, sinus pain and sore throat.   Respiratory:  Positive for cough. Negative for shortness of breath.   Cardiovascular:  Negative for chest pain and palpitations.  Gastrointestinal:  Negative for diarrhea and vomiting.  Skin:  Negative for color change and rash.  All other systems reviewed and are negative.   Physical Exam Triage Vital Signs ED Triage Vitals  Enc Vitals Group     BP 02/28/21 1128 110/80     Pulse Rate 02/28/21 1126 72     Resp 02/28/21 1126 18     Temp 02/28/21 1126 98.2 F (36.8 C)     Temp Source 02/28/21 1126 Oral     SpO2 02/28/21 1126 95 %     Weight --      Height --      Head Circumference --      Peak Flow --      Pain Score  02/28/21 1126 8     Pain Loc --      Pain Edu? --      Excl. in [place]? --    No data found.  Updated Vital Signs BP 110/80    Pulse 72    Temp 98.2 F (36.8 C) (Oral)    Resp 18    SpO2 98%   Visual Acuity Right Eye Distance:   Left Eye Distance:   Bilateral Distance:    Right Eye Near:   Left Eye Near:    Bilateral Near:     Physical Exam Vitals and nursing note reviewed.  Constitutional:      General: She is not in acute distress.    Appearance: Normal appearance. She is well-developed.  HENT:     Right Ear: Tympanic membrane normal.     Left Ear: Tympanic membrane normal.     Nose: Congestion present.     Mouth/Throat:     Mouth: Mucous membranes are moist.     Pharynx: Oropharynx is clear.  Cardiovascular:     Rate and Rhythm: Normal rate and regular rhythm.     Heart sounds: Normal heart sounds.  Pulmonary:     Effort: Pulmonary effort is normal. No respiratory distress.     Breath sounds: Normal breath sounds.  Musculoskeletal:     Cervical back: Neck supple.  Skin:    General: Skin is warm and dry.  Neurological:     Mental Status: She is alert.  Psychiatric:        Mood and Affect: Mood normal.        Behavior: Behavior normal.     UC Treatments / Results  Labs (all labs ordered are listed, but only abnormal results are displayed) Labs Reviewed - No data to display  EKG   Radiology No results found.  Procedures Procedures (including critical care time)  Medications Ordered in UC Medications - No data to display  Initial Impression / Assessment and Plan / UC Course  I have reviewed the triage vital signs and the nursing notes.  Pertinent labs & imaging results that were available during my care of the patient were reviewed by me and considered in my medical decision making (see chart for details).   Acute sinusitis.  Patient has been symptomatic for 7 days and is not improving with OTC treatment.  Treating with amoxicillin, Mucinex,  ibuprofen.  Instructed her to follow-up with her PCP if her symptoms are not improving.  Education provided on sinusitis.  Patient agrees to plan of  care.   Final Clinical Impressions(s) / UC Diagnoses   Final diagnoses:  Acute non-recurrent maxillary sinusitis     Discharge Instructions      Take the amoxicillin as directed.  Take plain Mucinex and ibuprofen as directed.  Follow up with your primary care provider if your symptoms are not improving.         ED Prescriptions     Medication Sig Dispense Auth. Provider   amoxicillin (AMOXIL) 875 MG tablet Take 1 tablet (875 mg total) by mouth 2 (two) times daily for 10 days. 20 tablet Sharion Balloon, NP      PDMP not reviewed this encounter.   Sharion Balloon, NP 02/28/21 1159

## 2021-04-08 ENCOUNTER — Other Ambulatory Visit: Payer: Self-pay | Admitting: Family Medicine

## 2021-04-08 DIAGNOSIS — Z1231 Encounter for screening mammogram for malignant neoplasm of breast: Secondary | ICD-10-CM

## 2021-05-16 ENCOUNTER — Ambulatory Visit
Admission: RE | Admit: 2021-05-16 | Discharge: 2021-05-16 | Disposition: A | Discharge status: Home / Self Care | Payer: BC Managed Care – PPO | Source: Ambulatory Visit | Attending: Family Medicine | Admitting: Family Medicine

## 2021-05-16 ENCOUNTER — Other Ambulatory Visit: Payer: Self-pay

## 2021-05-16 DIAGNOSIS — Z1231 Encounter for screening mammogram for malignant neoplasm of breast: Secondary | ICD-10-CM | POA: Insufficient documentation

## 2021-12-08 ENCOUNTER — Other Ambulatory Visit: Payer: Self-pay | Admitting: Orthopedic Surgery

## 2021-12-08 ENCOUNTER — Encounter: Payer: Self-pay | Admitting: Orthopedic Surgery

## 2021-12-15 ENCOUNTER — Ambulatory Visit: Payer: BC Managed Care – PPO | Admitting: Anesthesiology

## 2021-12-15 ENCOUNTER — Other Ambulatory Visit: Payer: Self-pay

## 2021-12-15 ENCOUNTER — Encounter: Payer: Self-pay | Admitting: Orthopedic Surgery

## 2021-12-15 ENCOUNTER — Ambulatory Visit
Admission: RE | Admit: 2021-12-15 | Discharge: 2021-12-15 | Disposition: A | Discharge status: Home / Self Care | Payer: BC Managed Care – PPO | Source: Ambulatory Visit | Attending: Orthopedic Surgery | Admitting: Orthopedic Surgery

## 2021-12-15 ENCOUNTER — Encounter
Admission: RE | Disposition: A | Discharge status: Home / Self Care | Payer: Self-pay | Source: Ambulatory Visit | Attending: Orthopedic Surgery

## 2021-12-15 ENCOUNTER — Ambulatory Visit: Payer: BC Managed Care – PPO

## 2021-12-15 DIAGNOSIS — G610 Guillain-Barre syndrome: Secondary | ICD-10-CM | POA: Insufficient documentation

## 2021-12-15 DIAGNOSIS — F17210 Nicotine dependence, cigarettes, uncomplicated: Secondary | ICD-10-CM | POA: Diagnosis not present

## 2021-12-15 DIAGNOSIS — T8484XA Pain due to internal orthopedic prosthetic devices, implants and grafts, initial encounter: Secondary | ICD-10-CM | POA: Diagnosis present

## 2021-12-15 DIAGNOSIS — E669 Obesity, unspecified: Secondary | ICD-10-CM | POA: Insufficient documentation

## 2021-12-15 HISTORY — PX: HARDWARE REMOVAL: SHX979

## 2021-12-15 SURGERY — REMOVAL, HARDWARE
Anesthesia: General | Laterality: Left

## 2021-12-15 MED ORDER — ACETAMINOPHEN 10 MG/ML IV SOLN: 1000.0000 mg | Freq: Once | INTRAVENOUS | Status: DC | PRN

## 2021-12-15 MED ORDER — OXYCODONE HCL 5 MG/5ML PO SOLN: 5.0000 mg | Freq: Once | ORAL | Status: DC | PRN

## 2021-12-15 MED ORDER — IBUPROFEN 800 MG PO TABS: 800.0000 mg | ORAL_TABLET | Freq: Three times a day (TID) | ORAL | 0 refills | Status: AC

## 2021-12-15 MED ORDER — LACTATED RINGERS IV SOLN: INTRAVENOUS | Status: DC

## 2021-12-15 MED ORDER — SCOPOLAMINE 1 MG/3DAYS TD PT72: 1.0000 | MEDICATED_PATCH | Freq: Once | TRANSDERMAL | Status: DC

## 2021-12-15 MED ORDER — ONDANSETRON 4 MG PO TBDP: 4.0000 mg | ORAL_TABLET | Freq: Three times a day (TID) | ORAL | 0 refills | Status: AC | PRN

## 2021-12-15 MED ORDER — ONDANSETRON HCL 4 MG/2ML IJ SOLN
INTRAMUSCULAR | Status: DC | PRN
  Administered 2021-12-15: 4 mg via INTRAVENOUS

## 2021-12-15 MED ORDER — HYDROCODONE-ACETAMINOPHEN 5-325 MG PO TABS: 1.0000 | ORAL_TABLET | ORAL | 0 refills | Status: AC | PRN

## 2021-12-15 MED ORDER — PROPOFOL 10 MG/ML IV BOLUS
INTRAVENOUS | Status: DC | PRN
  Administered 2021-12-15: 200 mg via INTRAVENOUS

## 2021-12-15 MED ORDER — BUPIVACAINE HCL (PF) 0.5 % IJ SOLN
INTRAMUSCULAR | Status: DC | PRN
  Administered 2021-12-15: 7 mL

## 2021-12-15 MED ORDER — ACETAMINOPHEN 10 MG/ML IV SOLN
INTRAVENOUS | Status: DC | PRN
  Administered 2021-12-15: 1000 mg via INTRAVENOUS

## 2021-12-15 MED ORDER — ONDANSETRON HCL 4 MG/2ML IJ SOLN: 4.0000 mg | Freq: Once | INTRAMUSCULAR | Status: DC | PRN

## 2021-12-15 MED ORDER — OXYCODONE HCL 5 MG PO TABS: 5.0000 mg | ORAL_TABLET | Freq: Once | ORAL | Status: DC | PRN

## 2021-12-15 MED ORDER — FENTANYL CITRATE PF 50 MCG/ML IJ SOSY: 25.0000 ug | PREFILLED_SYRINGE | INTRAMUSCULAR | Status: DC | PRN

## 2021-12-15 MED ORDER — ACETAMINOPHEN 500 MG PO TABS: 1000.0000 mg | ORAL_TABLET | Freq: Three times a day (TID) | ORAL | 2 refills | Status: AC

## 2021-12-15 MED ORDER — DEXAMETHASONE SODIUM PHOSPHATE 4 MG/ML IJ SOLN
INTRAMUSCULAR | Status: DC | PRN
  Administered 2021-12-15: 8 mg via INTRAVENOUS

## 2021-12-15 MED ORDER — LIDOCAINE HCL (CARDIAC) PF 100 MG/5ML IV SOSY
PREFILLED_SYRINGE | INTRAVENOUS | Status: DC | PRN
  Administered 2021-12-15: 30 mg via INTRATRACHEAL

## 2021-12-15 MED ORDER — ASPIRIN 325 MG PO TBEC: 325.0000 mg | DELAYED_RELEASE_TABLET | Freq: Every day | ORAL | 0 refills | Status: AC

## 2021-12-15 MED ORDER — BUPIVACAINE LIPOSOME 1.3 % IJ SUSP
INTRAMUSCULAR | Status: DC | PRN
  Administered 2021-12-15: 7 mL

## 2021-12-15 MED ORDER — FENTANYL CITRATE (PF) 100 MCG/2ML IJ SOLN
INTRAMUSCULAR | Status: DC | PRN
  Administered 2021-12-15 (×2): 50 ug via INTRAVENOUS

## 2021-12-15 MED ORDER — MIDAZOLAM HCL 5 MG/5ML IJ SOLN
INTRAMUSCULAR | Status: DC | PRN
  Administered 2021-12-15: 2 mg via INTRAVENOUS

## 2021-12-15 MED ORDER — CEFAZOLIN SODIUM-DEXTROSE 2-4 GM/100ML-% IV SOLN
2.0000 g | INTRAVENOUS | Status: AC
  Administered 2021-12-15: 2 g via INTRAVENOUS

## 2021-12-15 SURGICAL SUPPLY — 42 items
APL PRP STRL LF DISP 70% ISPRP (MISCELLANEOUS) ×1
BLADE SURG SZ10 CARB STEEL (BLADE) ×2 IMPLANT
BNDG CMPR STD VLCR NS LF 5.8X4 (GAUZE/BANDAGES/DRESSINGS) ×1
BNDG CMPR STD VLCR NS LF 5.8X6 (GAUZE/BANDAGES/DRESSINGS) ×1
BNDG COHESIVE 4X5 TAN STRL (GAUZE/BANDAGES/DRESSINGS) ×1 IMPLANT
BNDG ELASTIC 4X5.8 VLCR NS LF (GAUZE/BANDAGES/DRESSINGS) ×1 IMPLANT
BNDG ELASTIC 6X5.8 VLCR NS LF (GAUZE/BANDAGES/DRESSINGS) ×1 IMPLANT
BNDG ESMARK 4X12 TAN STRL LF (GAUZE/BANDAGES/DRESSINGS) ×1 IMPLANT
CANISTER SUCT 1200ML W/VALVE (MISCELLANEOUS) ×1 IMPLANT
CHLORAPREP W/TINT 26 (MISCELLANEOUS) ×1 IMPLANT
CUFF TOURN SGL QUICK 30 (TOURNIQUET CUFF) ×1
CUFF TRNQT CYL 30X4X21-28X (TOURNIQUET CUFF) ×1 IMPLANT
DRAPE C-ARM XRAY 36X54 (DRAPES) ×1 IMPLANT
DRAPE C-ARMOR (DRAPES) ×1 IMPLANT
DRAPE IMP U-DRAPE 54X76 (DRAPES) ×1 IMPLANT
ELECT REM PT RETURN 9FT ADLT (ELECTROSURGICAL) ×1
ELECTRODE REM PT RTRN 9FT ADLT (ELECTROSURGICAL) ×1 IMPLANT
GAUZE SPONGE 4X4 12PLY STRL (GAUZE/BANDAGES/DRESSINGS) ×1 IMPLANT
GAUZE XEROFORM 1X8 LF (GAUZE/BANDAGES/DRESSINGS) ×1 IMPLANT
GLOVE SRG 8 PF TXTR STRL LF DI (GLOVE) ×1 IMPLANT
GLOVE SURG ENC MOIS LTX SZ7.5 (GLOVE) ×1 IMPLANT
GLOVE SURG UNDER POLY LF SZ8 (GLOVE) ×1
GOWN STRL REIN 2XL XLG LVL4 (GOWN DISPOSABLE) ×1 IMPLANT
GOWN STRL REUS W/ TWL LRG LVL3 (GOWN DISPOSABLE) ×1 IMPLANT
GOWN STRL REUS W/TWL LRG LVL3 (GOWN DISPOSABLE) ×1
KIT TURNOVER KIT A (KITS) ×1 IMPLANT
NS IRRIG 500ML POUR BTL (IV SOLUTION) ×1 IMPLANT
PACK EXTREMITY ARMC (MISCELLANEOUS) ×1 IMPLANT
PADDING CAST BLEND 4X4 NS (MISCELLANEOUS) ×3 IMPLANT
PADDING CAST BLEND 4X4 STRL (MISCELLANEOUS) ×1 IMPLANT
PADDING CAST BLEND 6X4 STRL (MISCELLANEOUS) ×2 IMPLANT
PENCIL SMOKE EVACUATOR (MISCELLANEOUS) ×1 IMPLANT
SPLINT PLASTER CAST FAST 5X30 (CAST SUPPLIES) ×1 IMPLANT
SPONGE LAP 18X18 RF (DISPOSABLE) ×2 IMPLANT
STAPLER SKIN PROX 35W (STAPLE) ×1 IMPLANT
STOCKINETTE STRL 6IN 960660 (GAUZE/BANDAGES/DRESSINGS) ×1 IMPLANT
SUT VIC AB 0 CT1 27 (SUTURE) ×1
SUT VIC AB 0 CT1 27XCR 8 STRN (SUTURE) ×1 IMPLANT
SUT VIC AB 0 CT1 36 (SUTURE) IMPLANT
SUT VIC AB 3-0 SH 27 (SUTURE) ×1
SUT VIC AB 3-0 SH 27X BRD (SUTURE) ×1 IMPLANT
TOWEL OR 17X26 4PK STRL BLUE (TOWEL DISPOSABLE) ×1 IMPLANT

## 2021-12-15 NOTE — Transfer of Care (Signed)
Immediate Anesthesia Transfer of Care Note  Patient: Alyssa Johnson  Procedure(s) Performed: Left ankle hardware removal (Left)  Patient Location: PACU  Anesthesia Type: General  Level of Consciousness: awake, alert  and patient cooperative  Airway and Oxygen Therapy: Patient Spontanous Breathing and Patient connected to supplemental oxygen  Post-op Assessment: Post-op Vital signs reviewed, Patient's Cardiovascular Status Stable, Respiratory Function Stable, Patent Airway and No signs of Nausea or vomiting  Post-op Vital Signs: Reviewed and stable  Complications: No notable events documented.

## 2021-12-15 NOTE — Anesthesia Postprocedure Evaluation (Signed)
Anesthesia Post Note  Patient: Alyssa Johnson  Procedure(s) Performed: Left ankle hardware removal (Left)  Patient location during evaluation: PACU Anesthesia Type: General Level of consciousness: awake and alert, oriented and patient cooperative Pain management: pain level controlled Vital Signs Assessment: post-procedure vital signs reviewed and stable Respiratory status: spontaneous breathing, nonlabored ventilation and respiratory function stable Cardiovascular status: blood pressure returned to baseline and stable Postop Assessment: adequate PO intake Anesthetic complications: no   No notable events documented.   Last Vitals:  Vitals:   12/15/21 1510 12/15/21 1515  BP:  114/80  Pulse: 75 (!) 57  Resp: 13 14  Temp:    SpO2: 97% 99%    Last Pain:  Vitals:   12/15/21 1515  TempSrc:   PainSc: 0-No pain                 Darrin Nipper

## 2021-12-15 NOTE — Op Note (Addendum)
Operative Note    SURGERY DATE: 12/15/2021   PRE-OP DIAGNOSIS:  1.  Left ankle painful retained hardware   POST-OP DIAGNOSIS:  1.  Left ankle painful retained hardware   PROCEDURES:  1.  Left ankle removal of deep hardware   SURGEON: Cato Mulligan, MD   ANESTHESIA: Gen   ESTIMATED BLOOD LOSS: 5cc   TOTAL IV FLUIDS: see anesthesia record   INDICATION(S): Alyssa Johnson is a 56 y.o. female who initially ORIF left lateral malleolus by me on 10/25/2017.  She eventually began to develop pain and tenderness over the lateral malleolus hardware. After discussion of risks, benefits, and alternatives to surgery, the patient elected to proceed.     OPERATIVE REPORT:   I identified Alyssa Johnson in the pre-operative holding area. Informed consent was obtained and the surgical site was marked. I reviewed the risks and benefits of the proposed surgical intervention and the patient wished to proceed. The patient was transferred to the operative suite and spinal anesthesia was administered. The patient was transferred to the operating room table and placed in a supine position. All down side pressure points were appropriately padded. Appropriate IV antibiotics were administered within 30 minutes before incision. The extremity was then prepped and draped in standard fashion. A time out was performed confirming the correct extremity, correct patient, and correct procedure.    Utilizing the prior incision, dissection was carried down to the lateral malleolar hardware.  Bovie electrocautery was used to remove any soft tissue from the screw heads.  All 6 screws affixing the lateral malleolar plate to the lateral malleolus were removed.  Plate was then removed.  Screw holes were rongeured and curetted.  The 2 anterior to posterior lag screws were also then subsequently removed.  The wound was thoroughly irrigated.  Complete removal of hardware was confirmed with fluoroscopy. Local anesthetic was injected  about the incision.     Deep layer was closed with 0 Vicryl.  Subdermal layer was closed with buried 3-0 Vicryl, and skin was closed with staples.  Sterile soft dressing was applied.  The patient was awakened and transferred to a stretcher bed and to the post anesthesia care unit in stable condition.    POSTOPERATIVE PLAN: Partial weightbearing on operative extremity x2 weeks. Patient to return to clinic in ~2 weeks for post-operative appointment.  No formal physical therapy required at this time.  ASA 325 mg/day x 2 weeks for DVT prophylaxis.

## 2021-12-15 NOTE — H&P (Signed)
Paper H&P to be scanned into permanent record. H&P reviewed. No significant changes noted.  

## 2021-12-15 NOTE — Anesthesia Procedure Notes (Signed)
Procedure Name: LMA Insertion Date/Time: 12/15/2021 1:15 PM  Performed by: Londell Moh, CRNAPre-anesthesia Checklist: Patient identified, Patient being monitored, Timeout performed, Emergency Drugs available and Suction available Patient Re-evaluated:Patient Re-evaluated prior to induction Oxygen Delivery Method: Circle system utilized Preoxygenation: Pre-oxygenation with 100% oxygen Induction Type: IV induction Ventilation: Mask ventilation without difficulty LMA: LMA inserted LMA Size: 4.0 Tube type: Oral Number of attempts: 1 Placement Confirmation: positive ETCO2 and breath sounds checked- equal and bilateral Tube secured with: Tape Dental Injury: Teeth and Oropharynx as per pre-operative assessment

## 2021-12-15 NOTE — Discharge Instructions (Signed)
Ankle Surgery - Removal of Hardware   Post-Op Instructions   1. Bracing or crutches: Crutches will be provided at the time of discharge from the surgery center if you do not already have them.   2. Ice: You may be provided with a device [facility]) that allows you to ice the affected area effectively. Otherwise you can ice manually.    3. Driving:  Plan on not driving for at least one week. Please note that you are advised NOT to drive while taking narcotic pain medications as you may be impaired and unsafe to drive.   4. Activity: Ankle pumps several times an hour while awake to prevent blood clots. Weight bearing: as tolerated. Use crutches/walker for for 1-2 weeks until your first postoperative visit. Bending and straightening the knee is unlimited.    5. Medications:  - You have been provided a prescription for narcotic pain medicine. After surgery, take 1-2 narcotic tablets every 4 hours if needed for severe pain.  - You may take up to '3000mg'$ /day of tylenol (acetaminophen). You can take '1000mg'$  3x/day. Please check your narcotic. If you have acetaminophen in your narcotic (each tablet will be '325mg'$ ), be careful not to exceed a total of '3000mg'$ /day of acetaminophen.  - A prescription for anti-nausea medication will be provided in case the narcotic medicine causes nausea - take 1 tablet every 6 hours only if nauseated.  - Take ibuprofen 800 mg every 8 hours WITH food to reduce post-operative knee swelling. DO NOT STOP IBUPROFEN POST-OP UNTIL INSTRUCTED TO DO SO at first post-op office visit (10-14 days after surgery). However, please discontinue if you have any abdominal discomfort after taking this.  - Take enteric coated aspirin 325 mg once daily for 2 weeks to prevent blood clots.   6. Bandages: May remove 5 days after surgery and shower afterwards.   7. Physical Therapy: None needed for this surgery generally   8. Work: May return to full work usually around 2 weeks after 1st  post-operative visit. May do light duty/desk job in approximately 1-2 weeks when off of narcotics, pain is well-controlled, and swelling has decreased. Labor intensive jobs may require 4-6 weeks to return.      9. Post-Op Appointments: Your first post-op appointment will be with Dr. Posey Pronto in approximately 2 weeks time.    If you find that they have not been scheduled please call the Orthopaedic Appointment front desk at [phone].

## 2021-12-15 NOTE — Anesthesia Preprocedure Evaluation (Addendum)
Anesthesia Evaluation  Patient identified by MRN, date of birth, ID band Patient awake    Reviewed: Allergy & Precautions, NPO status , Patient's Chart, lab work & pertinent test results  History of Anesthesia Complications (+) PONV and history of anesthetic complications  Airway Mallampati: I   Neck ROM: Full    Dental  (+) Missing   Pulmonary Current Smoker (2-3 cigarettes per day)Patient did not abstain from smoking.,    Pulmonary exam normal breath sounds clear to auscultation       Cardiovascular Exercise Tolerance: Good negative cardio ROS Normal cardiovascular exam Rhythm:Regular Rate:Normal     Neuro/Psych PSYCHIATRIC DISORDERS Depression  Neuromuscular disease (Guillain Barre syndrome 1993)    GI/Hepatic GERD  ,  Endo/Other  Obesity   Renal/GU negative Renal ROS     Musculoskeletal   Abdominal   Peds  Hematology negative hematology ROS (+)   Anesthesia Other Findings Cardiology note 10/01/20:  Left-sided chest pain Musculoskeletal, reproducible on palpation left pectoral area radiating around left axilla left scapular area, same rib region Likely has a " rib out", Discussed ergonomics, posture at work, need for lumbar support, shoulders back possibly with a support Discussed icing her back, chiropractic, NSAIDs Avoiding heavy lifting  History of Guillain-Barr syndrome Noted in the chart  History of pulmonary embolism Recent cardiac CTA reviewed, no PE  Reproductive/Obstetrics                            Anesthesia Physical Anesthesia Plan  ASA: 2  Anesthesia Plan: General   Post-op Pain Management:    Induction: Intravenous  PONV Risk Score and Plan: 3 and Ondansetron, Dexamethasone and Treatment may vary due to age or medical condition  Airway Management Planned: LMA  Additional Equipment:   Intra-op Plan:   Post-operative Plan: Extubation in OR  Informed  Consent: I have reviewed the patients History and Physical, chart, labs and discussed the procedure including the risks, benefits and alternatives for the proposed anesthesia with the patient or authorized representative who has indicated his/her understanding and acceptance.     Dental advisory given  Plan Discussed with: CRNA  Anesthesia Plan Comments: (Avoid succinylcholine for history of Gullain Barre.  Patient consented for risks of anesthesia including but not limited to:  - adverse reactions to medications - damage to eyes, teeth, lips or other oral mucosa - nerve damage due to positioning  - sore throat or hoarseness - damage to heart, brain, nerves, lungs, other parts of body or loss of life  Informed patient about role of CRNA in peri- and intra-operative care.  Patient voiced understanding.)        Anesthesia Quick Evaluation

## 2021-12-16 ENCOUNTER — Encounter: Payer: Self-pay | Admitting: Orthopedic Surgery

## 2022-05-31 IMAGING — MG MM DIGITAL SCREENING BILAT W/ TOMO AND CAD
8 series · 8 of 24 positions shown · non-contrast
Comparison: Previous exam(s).

CLINICAL DATA: Screening.

EXAM:
DIGITAL SCREENING BILATERAL MAMMOGRAM WITH TOMOSYNTHESIS AND CAD
TECHNIQUE: Bilateral screening digital craniocaudal and mediolateral oblique
mammograms were obtained. Bilateral screening digital breast
tomosynthesis was performed. The images were evaluated with
computer-aided detection.

[L CC synth-2D]
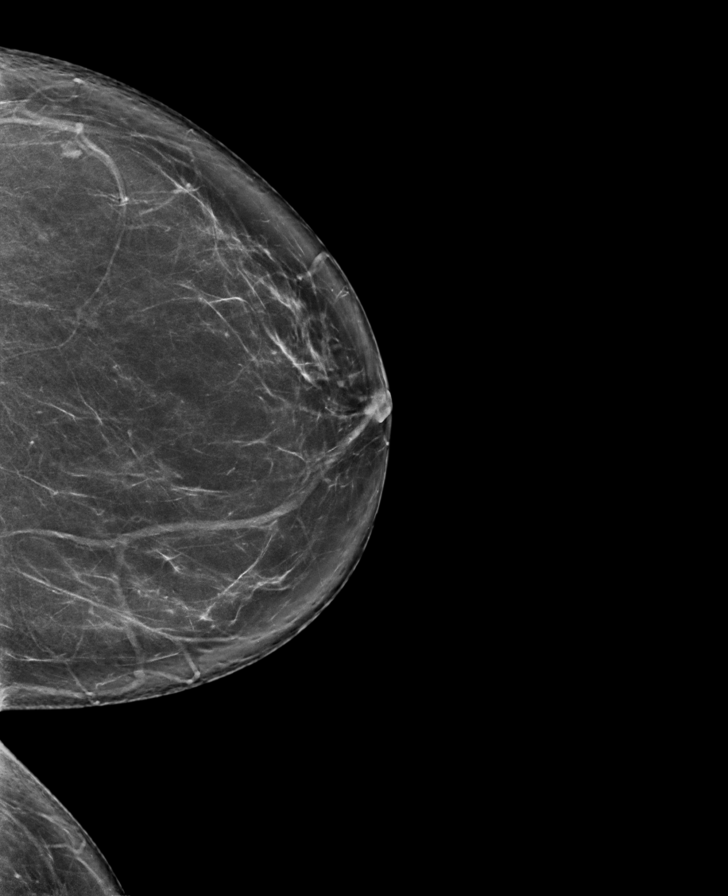

[L MLO synth-2D]
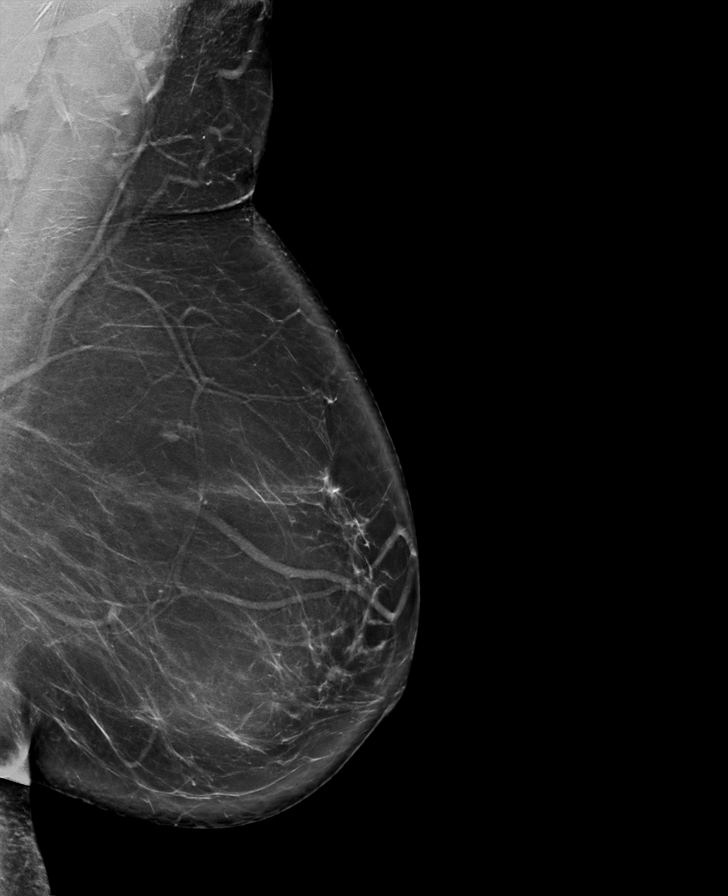

[R MLO synth-2D]
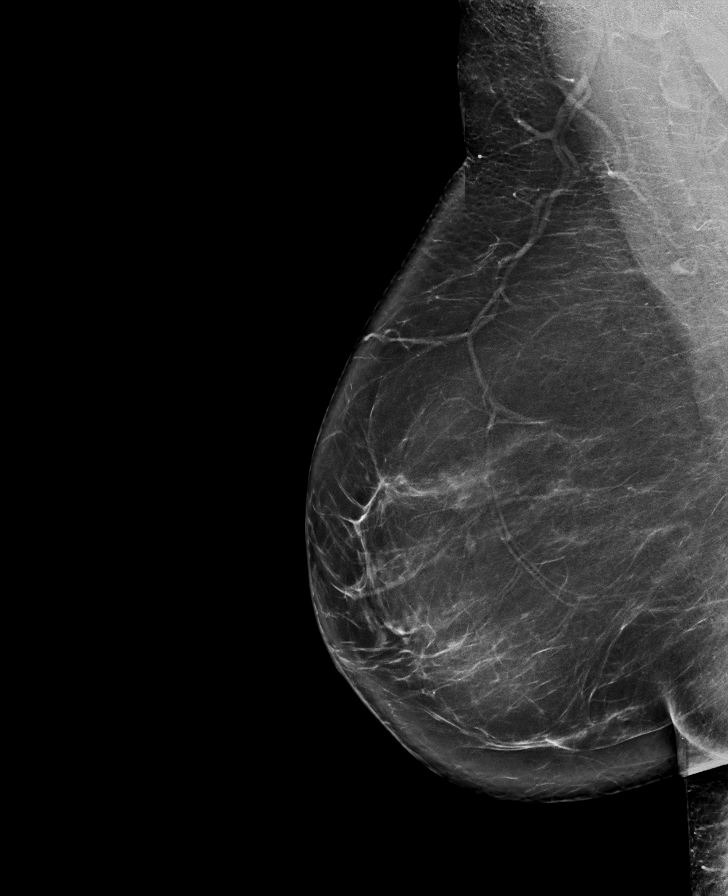

[R CC synth-2D]
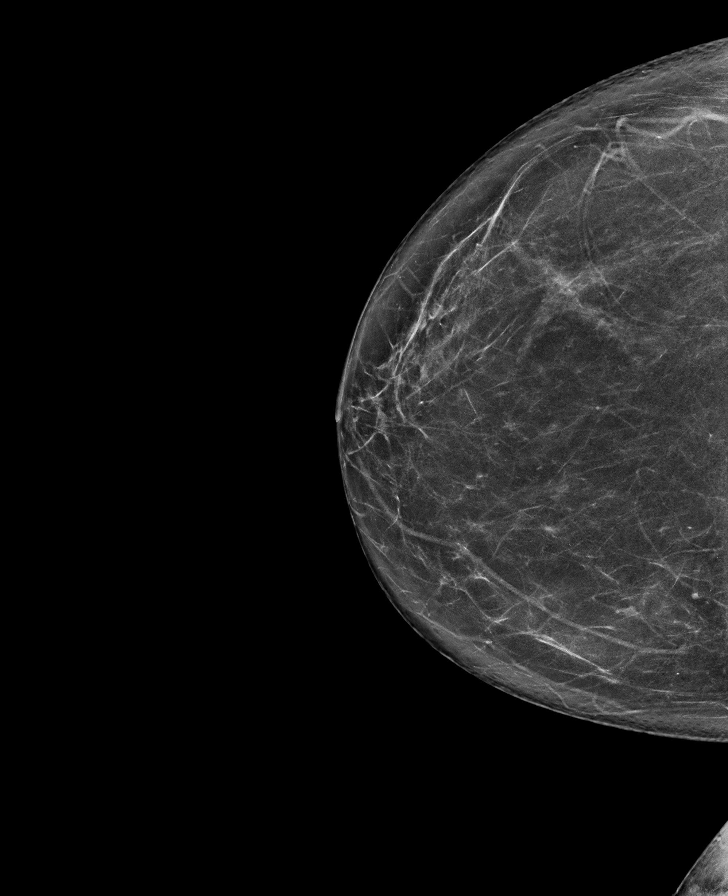

[R CC tomo · tomo slice 39/78.0]
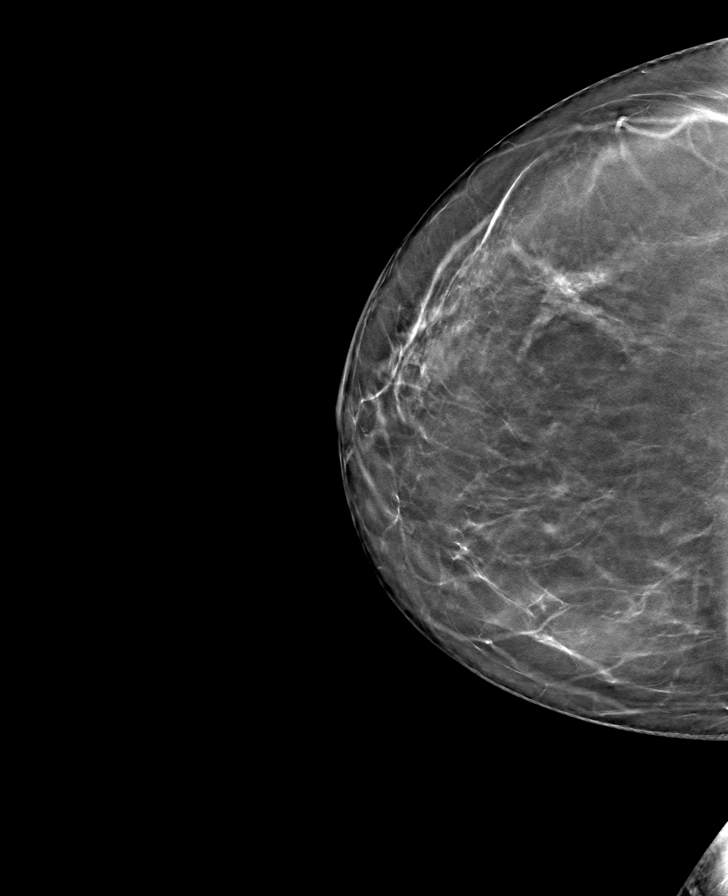

[R MLO tomo · tomo slice 46/91.0]
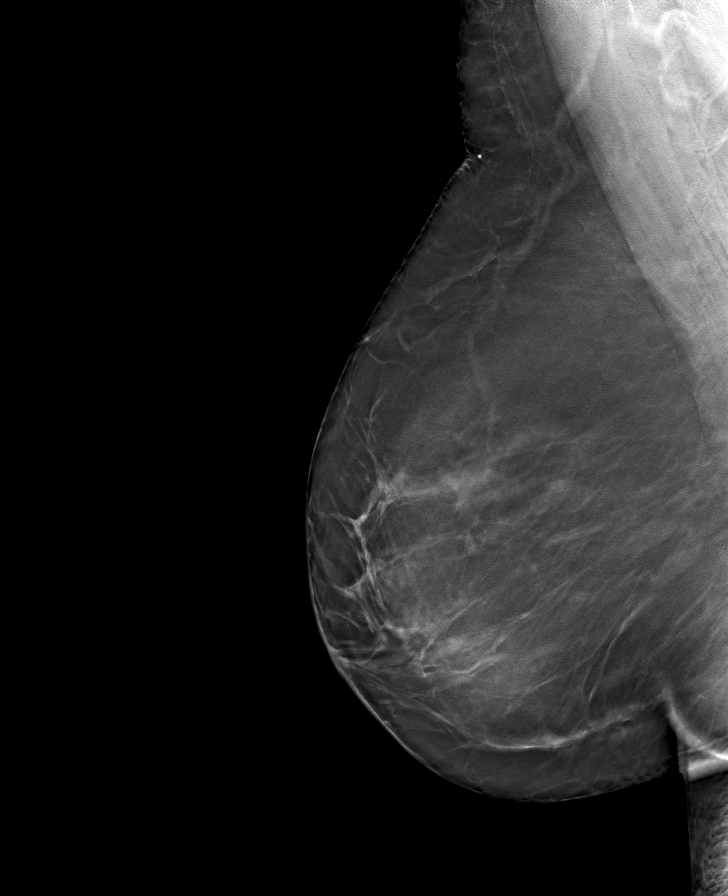

[L CC tomo · tomo slice 41/82.0]
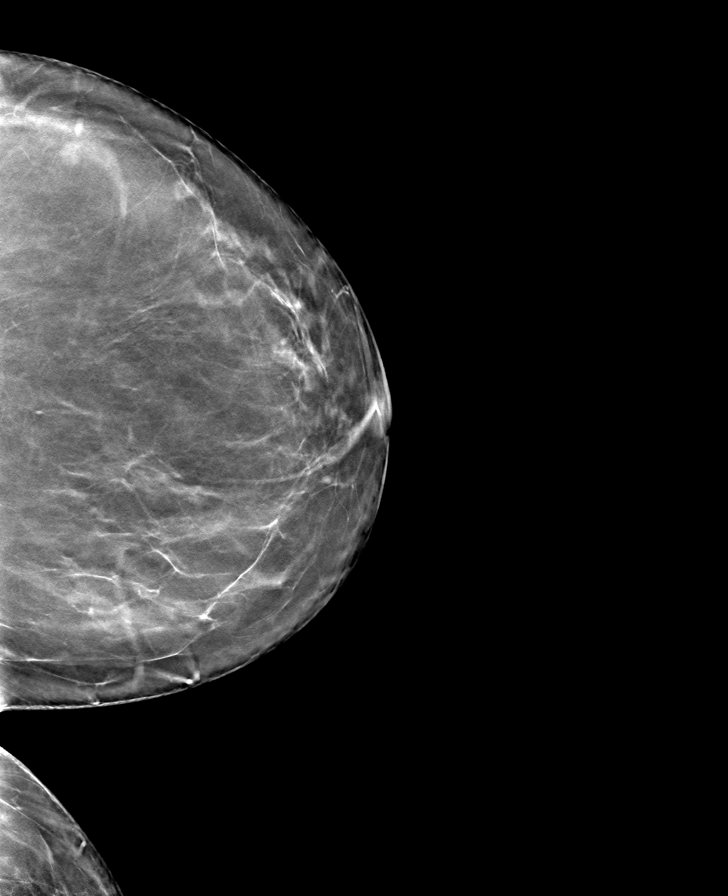

[L MLO tomo · tomo slice 47/94.0]
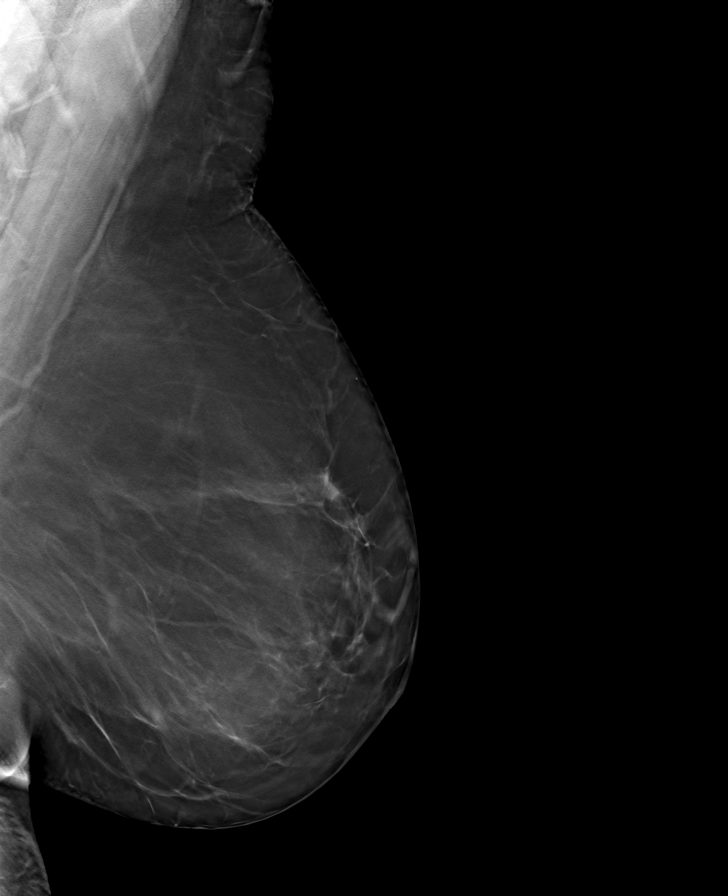

[8 of 24 positions shown; findings below may reference images not displayed]

ACR Breast Density Category b: There are scattered areas of
fibroglandular density.
FINDINGS: There are no findings suspicious for malignancy.
IMPRESSION: No mammographic evidence of malignancy. A result letter of this
screening mammogram will be mailed directly to the patient.

RECOMMENDATION:
Screening mammogram in one year. (Code:51-O-LD2)

BI-RADS CATEGORY  1: Negative.

## 2023-10-04 ENCOUNTER — Other Ambulatory Visit: Payer: Self-pay | Admitting: Medical Genetics

## 2023-10-08 ENCOUNTER — Other Ambulatory Visit: Payer: Self-pay

## 2023-10-18 ENCOUNTER — Other Ambulatory Visit
Admission: RE | Admit: 2023-10-18 | Discharge: 2023-10-18 | Disposition: A | Discharge status: Home / Self Care | Payer: Self-pay | Source: Ambulatory Visit | Attending: Medical Genetics | Admitting: Medical Genetics

## 2023-10-26 LAB — GENECONNECT MOLECULAR SCREEN: Genetic Analysis Overall Interpretation: NEGATIVE

## 2023-11-26 ENCOUNTER — Other Ambulatory Visit: Payer: Self-pay | Admitting: Internal Medicine

## 2023-11-26 DIAGNOSIS — Z1231 Encounter for screening mammogram for malignant neoplasm of breast: Secondary | ICD-10-CM

## 2023-12-16 ENCOUNTER — Ambulatory Visit
Admission: RE | Admit: 2023-12-16 | Discharge: 2023-12-16 | Disposition: A | Discharge status: Home / Self Care | Source: Ambulatory Visit | Attending: Internal Medicine | Admitting: Internal Medicine

## 2023-12-16 DIAGNOSIS — Z1231 Encounter for screening mammogram for malignant neoplasm of breast: Secondary | ICD-10-CM | POA: Diagnosis present
